# Patient Record
Sex: Female | Born: 1985 | Race: Black or African American | Hispanic: No | Marital: Single | Smoking: Never smoker
Health system: Southern US, Community
[De-identification: ages and names within clinical notes are randomized; demographics above are authoritative.]

## PROBLEM LIST (undated history)

## (undated) ENCOUNTER — Ambulatory Visit: Admission: EM

## (undated) DIAGNOSIS — K219 Gastro-esophageal reflux disease without esophagitis: Secondary | ICD-10-CM

## (undated) DIAGNOSIS — Z9109 Other allergy status, other than to drugs and biological substances: Secondary | ICD-10-CM

## (undated) DIAGNOSIS — E119 Type 2 diabetes mellitus without complications: Secondary | ICD-10-CM

## (undated) DIAGNOSIS — D649 Anemia, unspecified: Secondary | ICD-10-CM

## (undated) DIAGNOSIS — B977 Papillomavirus as the cause of diseases classified elsewhere: Secondary | ICD-10-CM

## (undated) HISTORY — DX: Papillomavirus as the cause of diseases classified elsewhere: B97.7

## (undated) HISTORY — PX: BARTHOLIN CYST MARSUPIALIZATION: SHX5383

---

## 2004-08-02 ENCOUNTER — Emergency Department: Payer: Self-pay | Admitting: Emergency Medicine

## 2007-11-25 ENCOUNTER — Emergency Department: Payer: Self-pay | Admitting: Emergency Medicine

## 2010-02-18 ENCOUNTER — Emergency Department: Payer: Self-pay | Admitting: Emergency Medicine

## 2010-06-09 ENCOUNTER — Ambulatory Visit: Payer: Self-pay | Admitting: Obstetrics & Gynecology

## 2010-06-11 ENCOUNTER — Ambulatory Visit: Payer: Self-pay | Admitting: Obstetrics & Gynecology

## 2010-09-25 ENCOUNTER — Emergency Department: Payer: Self-pay | Admitting: Emergency Medicine

## 2010-09-26 ENCOUNTER — Emergency Department: Payer: Self-pay | Admitting: Emergency Medicine

## 2012-04-14 DIAGNOSIS — R011 Cardiac murmur, unspecified: Secondary | ICD-10-CM | POA: Insufficient documentation

## 2013-07-03 ENCOUNTER — Emergency Department: Payer: Self-pay | Admitting: Emergency Medicine

## 2014-06-06 ENCOUNTER — Ambulatory Visit: Payer: Self-pay | Admitting: Podiatrist

## 2015-12-19 ENCOUNTER — Ambulatory Visit
Admission: EM | Admit: 2015-12-19 | Discharge: 2015-12-19 | Disposition: A | Discharge status: Home / Self Care | Payer: PRIVATE HEALTH INSURANCE | Attending: Family Medicine | Admitting: Family Medicine

## 2015-12-19 ENCOUNTER — Encounter: Payer: Self-pay | Admitting: *Deleted

## 2015-12-19 DIAGNOSIS — J0100 Acute maxillary sinusitis, unspecified: Secondary | ICD-10-CM | POA: Diagnosis not present

## 2015-12-19 DIAGNOSIS — J069 Acute upper respiratory infection, unspecified: Secondary | ICD-10-CM

## 2015-12-19 HISTORY — DX: Other allergy status, other than to drugs and biological substances: Z91.09

## 2015-12-19 HISTORY — DX: Anemia, unspecified: D64.9

## 2015-12-19 MED ORDER — BENZONATATE 200 MG PO CAPS: 200.0000 mg | ORAL_CAPSULE | Freq: Three times a day (TID) | ORAL | Status: DC | PRN

## 2015-12-19 MED ORDER — FEXOFENADINE-PSEUDOEPHED ER 180-240 MG PO TB24: 1.0000 | ORAL_TABLET | Freq: Every day | ORAL | Status: DC

## 2015-12-19 MED ORDER — AMOXICILLIN-POT CLAVULANATE 875-125 MG PO TABS: 1.0000 | ORAL_TABLET | Freq: Two times a day (BID) | ORAL | Status: DC

## 2015-12-19 NOTE — Discharge Instructions (Signed)
Upper Respiratory Infection, Adult °Most upper respiratory infections (URIs) are caused by a virus. A URI affects the nose, throat, and upper air passages. The most common type of URI is often called "the common cold." °HOME CARE  °· Take medicines only as told by your doctor. °· Gargle warm saltwater or take cough drops to comfort your throat as told by your doctor. °· Use a warm mist humidifier or inhale steam from a shower to increase air moisture. This may make it easier to breathe. °· Drink enough fluid to keep your pee (urine) clear or pale yellow. °· Eat soups and other clear broths. °· Have a healthy diet. °· Rest as needed. °· Go back to work when your fever is gone or your doctor says it is okay. °· You may need to stay home longer to avoid giving your URI to others. °· You can also wear a face mask and wash your hands often to prevent spread of the virus. °· Use your inhaler more if you have asthma. °· Do not use any tobacco products, including cigarettes, chewing tobacco, or electronic cigarettes. If you need help quitting, ask your doctor. °GET HELP IF: °· You are getting worse, not better. °· Your symptoms are not helped by medicine. °· You have chills. °· You are getting more short of breath. °· You have brown or red mucus. °· You have yellow or brown discharge from your nose. °· You have pain in your face, especially when you bend forward. °· You have a fever. °· You have puffy (swollen) neck glands. °· You have pain while swallowing. °· You have white areas in the back of your throat. °GET HELP RIGHT AWAY IF:  °· You have very bad or constant: °· Headache. °· Ear pain. °· Pain in your forehead, behind your eyes, and over your cheekbones (sinus pain). °· Chest pain. °· You have long-lasting (chronic) lung disease and any of the following: °· Wheezing. °· Long-lasting cough. °· Coughing up blood. °· A change in your usual mucus. °· You have a stiff neck. °· You have changes in  your: °· Vision. °· Hearing. °· Thinking. °· Mood. °MAKE SURE YOU:  °· Understand these instructions. °· Will watch your condition. °· Will get help right away if you are not doing well or get worse. °  °This information is not intended to replace advice given to you by your health care provider. Make sure you discuss any questions you have with your health care provider. °  °Document Released: 04/05/2008 Document Revised: 03/04/2015 Document Reviewed: 01/23/2014 °Elsevier Interactive Patient Education ©2016 [employer] ° °Sinusitis, Adult °Sinusitis is redness, soreness, and puffiness (inflammation) of the air pockets in the bones of your face (sinuses). The redness, soreness, and puffiness can cause air and mucus to get trapped in your sinuses. This can allow germs to grow and cause an infection.  °HOME CARE  °· Drink enough fluids to keep your pee (urine) clear or pale yellow. °· Use a humidifier in your home. °· Run a hot shower to create steam in the bathroom. Sit in the bathroom with the door closed. Breathe in the steam 3-4 times a day. °· Put a warm, moist washcloth on your face 3-4 times a day, or as told by your doctor. °· Use salt water sprays (saline sprays) to wet the thick fluid in your nose. This can help the sinuses drain. °· Only take medicine as told by your doctor. °GET HELP RIGHT AWAY IF:  °·   Your pain gets worse. °· You have very bad headaches. °· You are sick to your stomach (nauseous). °· You throw up (vomit). °· You are very sleepy (drowsy) all the time. °· Your face is puffy (swollen). °· Your vision changes. °· You have a stiff neck. °· You have trouble breathing. °MAKE SURE YOU:  °· Understand these instructions. °· Will watch your condition. °· Will get help right away if you are not doing well or get worse. °  °This information is not intended to replace advice given to you by your health care provider. Make sure you discuss any questions you have with your health care provider. °   °Document Released: 04/05/2008 Document Revised: 11/08/2014 Document Reviewed: 05/23/2012 °Elsevier Interactive Patient Education ©2016 [employer] ° °

## 2015-12-19 NOTE — ED Provider Notes (Signed)
CSN: [id number]     Arrival date & time 12/19/15  0865 History   First MD Initiated Contact with Patient 12/19/15 0900    Nurses notes were reviewed. Chief Complaint  Patient presents with  . Nasal Congestion  . Cough    Patient's here because of cough and nasal congestion. She reports no nasal congestion started on Sunday grossly getting worse. She states that she's had only yellowish-green drainage from the nostril she's coughing up clear mucus or chest. Denies any wheezing or ear pain but reports difficulty sleeping at night because of the congestion and difficulty breathing through her nostril and coughing is worse at night. She has irritated throat but not really sore throat this time. Patient does not smoke history of anemia environmental allergies.  No significant family history   (Consider location/radiation/quality/duration/timing/severity/associated sxs/prior Treatment) Patient is a 30 y.o. female presenting with cough. The history is provided by the patient. No language interpreter was used.  Cough Cough characteristics:  Productive and non-productive Sputum characteristics:  Green and yellow (Only when she blows her nose) Severity:  Moderate Onset quality:  Sudden Progression:  Worsening Context: upper respiratory infection   Relieved by:  Nothing Ineffective treatments:  Cough suppressants and steroid inhaler Associated symptoms: rhinorrhea and sinus congestion   Associated symptoms: no chest pain, no chills, no diaphoresis, no ear fullness, no ear pain, no fever, no headaches and no myalgias   Risk factors: no recent infection     Past Medical History  Diagnosis Date  . Anemia   . Environmental allergies    History reviewed. No pertinent past surgical history. History reviewed. No pertinent family history. Social History  Substance Use Topics  . Smoking status: Never Smoker   . Smokeless tobacco: Never Used  . Alcohol Use: No   OB History    No data available      Review of Systems  Constitutional: Negative for fever, chills and diaphoresis.  HENT: Positive for rhinorrhea. Negative for ear pain.   Respiratory: Positive for cough.   Cardiovascular: Negative for chest pain.  Musculoskeletal: Negative for myalgias.  Neurological: Negative for headaches.  All other systems reviewed and are negative.   Allergies  Review of patient's allergies indicates no known allergies.  Home Medications   Prior to Admission medications   Medication Sig Start Date End Date Taking? Authorizing Provider  amoxicillin-clavulanate (AUGMENTIN) 875-125 MG tablet Take 1 tablet by mouth 2 (two) times daily. 12/19/15   Hassan Rowan, MD  benzonatate (TESSALON) 200 MG capsule Take 1 capsule (200 mg total) by mouth 3 (three) times daily as needed for cough. 12/19/15   Hassan Rowan, MD  fexofenadine-pseudoephedrine (ALLEGRA-D ALLERGY & CONGESTION) 180-240 MG 24 hr tablet Take 1 tablet by mouth daily. 12/19/15   Hassan Rowan, MD   Meds Ordered and Administered this Visit  Medications - No data to display  BP 144/83 mmHg  Pulse 93  Temp(Src) 98.1 F (36.7 C) (Oral)  Resp 18  Ht  (1.6 m)  Wt 155 lb (70.308 kg)  BMI 27.46 kg/m2  SpO2 100%  LMP 12/18/2015 No data found.   Physical Exam  Constitutional: She is oriented to person, place, and time. She appears well-developed and well-nourished.  HENT:  Head: Normocephalic and atraumatic.  Right Ear: Hearing, tympanic membrane, external ear and ear canal normal.  Left Ear: Hearing, tympanic membrane, external ear and ear canal normal.  Nose: Mucosal edema and rhinorrhea present. Right sinus exhibits maxillary sinus tenderness. Left sinus exhibits  maxillary sinus tenderness.  Mouth/Throat: Posterior oropharyngeal erythema present.  Eyes: Conjunctivae are normal. Pupils are equal, round, and reactive to light.  Neck: Normal range of motion.  Cardiovascular: Normal rate and regular rhythm.   Pulmonary/Chest: Effort  normal and breath sounds normal.  Musculoskeletal: Normal range of motion.  Neurological: She is alert and oriented to person, place, and time.  Skin: Skin is warm.  Psychiatric: She has a normal mood and affect.  Vitals reviewed.   ED Course  Procedures (including critical care time)  Labs Review Labs Reviewed - No data to display  Imaging Review No results found.   Visual Acuity Review  Right Eye Distance:   Left Eye Distance:   Bilateral Distance:    Right Eye Near:   Left Eye Near:    Bilateral Near:         MDM   1. Acute maxillary sinusitis, recurrence not specified   2. Acute URI    We'll proceed to treat with antibiotics since it has been present for 5 days but getting worse. We'll place Augmentin 875 one tablet twice a day Tessalon Perles for cough she is using Flonase but did go over the proper way to administer Flonase and Allegra-D 24 hours 1 tablet a day. Work note for today and to see her PCP if not better in a week.    Hassan Rowan, MD 12/19/15 [phone]

## 2015-12-19 NOTE — ED Notes (Signed)
Patient started having nasal congestion one week ago. Patient has a history of allergies. Color of mucus is yellow.

## 2016-09-07 LAB — HM PAP SMEAR: HM PAP: NEGATIVE

## 2016-11-04 ENCOUNTER — Ambulatory Visit
Admission: EM | Admit: 2016-11-04 | Discharge: 2016-11-04 | Disposition: A | Discharge status: Home / Self Care | Payer: PRIVATE HEALTH INSURANCE | Attending: Family Medicine | Admitting: Family Medicine

## 2016-11-04 DIAGNOSIS — J0100 Acute maxillary sinusitis, unspecified: Secondary | ICD-10-CM

## 2016-11-04 DIAGNOSIS — R059 Cough, unspecified: Secondary | ICD-10-CM

## 2016-11-04 DIAGNOSIS — R05 Cough: Secondary | ICD-10-CM | POA: Diagnosis not present

## 2016-11-04 MED ORDER — FEXOFENADINE-PSEUDOEPHED ER 180-240 MG PO TB24: 1.0000 | ORAL_TABLET | Freq: Every day | ORAL | 0 refills | Status: DC

## 2016-11-04 MED ORDER — AMOXICILLIN-POT CLAVULANATE 875-125 MG PO TABS: 1.0000 | ORAL_TABLET | Freq: Two times a day (BID) | ORAL | 0 refills | Status: DC

## 2016-11-04 MED ORDER — FLUTICASONE PROPIONATE 50 MCG/ACT NA SUSP: 2.0000 | Freq: Every day | NASAL | 0 refills | Status: DC

## 2016-11-04 MED ORDER — HYDROCOD POLST-CPM POLST ER 10-8 MG/5ML PO SUER: 5.0000 mL | Freq: Two times a day (BID) | ORAL | 0 refills | Status: DC | PRN

## 2016-11-04 NOTE — ED Provider Notes (Signed)
[facility]    CSN: [id number] Arrival date & time: 11/04/16  1737     History   Chief Complaint Chief Complaint  Patient presents with  . Recurrent Sinusitis    HPI Tina Simon is a [age] y.o. female.   Tina Simon 31 year old black female who presents with nasal congestion and cough. Everything started over 2 weeks ago before Christmas about 2-1/2 weeks ago in fact. The nasal congestion is progressively gotten worse. Over the last 3-4 days a since of [place] to her chest and she is coughing as well. Since last 2 days work and wasn't able to go to work today. She denies any wheezing but of unable to sleep because the mucus is going down her throat she can't seem to bring it up. No known drug allergies. No previous surgeries or history of operations. No colonic medical problems in mother father both have hypertension and diabetes and patient never smoked.   The history is provided by the patient. No language interpreter was used.  URI  Presenting symptoms: congestion, cough, facial pain and rhinorrhea   Presenting symptoms: no ear pain, no fever and no sore throat   Severity:  Moderate Duration:  17 days Timing:  Constant Progression:  Worsening Chronicity:  New Relieved by:  Nothing Worsened by:  Nothing Ineffective treatments:  None tried Associated symptoms: sinus pain, sneezing and swollen glands   Associated symptoms: no arthralgias, no headaches, no myalgias, no neck pain and no wheezing     Past Medical History:  Diagnosis Date  . Anemia   . Environmental allergies     There are no active problems to display for this patient.   History reviewed. No pertinent surgical history.  OB History    No data available       Home Medications    Prior to Admission medications   Medication Sig Start Date End Date Taking? Authorizing Provider  amoxicillin-clavulanate (AUGMENTIN) 875-125 MG tablet Take 1 tablet by mouth 2 (two) times daily. 11/04/16   Hassan RowanEugene  Driana Dazey, MD  chlorpheniramine-HYDROcodone [facility](TUSSIONEX PENNKINETIC ER) 10-8 MG/5ML SUER Take 5 mLs by mouth every 12 (twelve) hours as needed for cough. 11/04/16   Hassan RowanEugene Dallas Scorsone, MD  fexofenadine-pseudoephedrine (ALLEGRA-D ALLERGY & CONGESTION) 180-240 MG 24 hr tablet Take 1 tablet by mouth daily. 11/04/16   Hassan RowanEugene Jailah Willis, MD  fluticasone (FLONASE) 50 MCG/ACT nasal spray Place 2 sprays into both nostrils daily. 11/04/16   Hassan RowanEugene Millette Halberstam, MD    Family History Family History  Problem Relation Age of Onset  . Diabetes Mother   . Hypertension Mother   . Hypertension Father   . Diabetes Father     Social History Social History  Substance Use Topics  . Smoking status: Never Smoker  . Smokeless tobacco: Never Used  . Alcohol use No     Allergies   Patient has no known allergies.   Review of Systems Review of Systems  Constitutional: Negative for fever.  HENT: Positive for congestion, rhinorrhea, sinus pain and sneezing. Negative for ear pain and sore throat.   Respiratory: Positive for cough. Negative for wheezing.   Musculoskeletal: Negative for arthralgias, myalgias and neck pain.  Neurological: Negative for headaches.  All other systems reviewed and are negative.    Physical Exam Triage Vital Signs ED Triage Vitals  Enc Vitals Group     BP 11/04/16 1833 129/84     Pulse Rate 11/04/16 1833 82     Resp 11/04/16 1833 18  Temp 11/04/16 1833 98.7 F (37.1 C)     Temp Source 11/04/16 1833 Oral     SpO2 11/04/16 1833 100 %     Weight 11/04/16 1834 165 lb (74.8 kg)     Height 11/04/16 1834 5\' 3"  (1.6 m)     Head Circumference --      Peak Flow --      Pain Score 11/04/16 1835 8     Pain Loc --      Pain Edu? --      Excl. in GC? --    No data found.   Updated Vital Signs BP 129/84 (BP Location: Left Arm)   Pulse 82   Temp 98.7 F (37.1 C) (Oral)   Resp 18   Ht 5\' 3"  (1.6 m)   Wt 165 lb (74.8 kg)   LMP 11/04/2016   SpO2 100%   BMI 29.23 kg/m   Visual Acuity Right Eye  Distance:   Left Eye Distance:   Bilateral Distance:    Right Eye Near:   Left Eye Near:    Bilateral Near:     Physical Exam  Constitutional: She appears well-developed and well-nourished. No distress.  HENT:  Head: Normocephalic and atraumatic.  Right Ear: Hearing, tympanic membrane, external ear and ear canal normal.  Left Ear: Hearing, tympanic membrane, external ear and ear canal normal.  Nose: Mucosal edema, rhinorrhea and sinus tenderness present. Right sinus exhibits maxillary sinus tenderness and frontal sinus tenderness. Left sinus exhibits maxillary sinus tenderness and frontal sinus tenderness.  Mouth/Throat: Uvula is midline, oropharynx is clear and moist and mucous membranes are normal.  Eyes: Conjunctivae are normal. Pupils are equal, round, and reactive to light.  Neck: Normal range of motion. Neck supple.  Cardiovascular: Normal rate and regular rhythm.   Pulmonary/Chest: Effort normal and breath sounds normal. No respiratory distress.  Musculoskeletal: Normal range of motion.  Neurological: She is alert.  Skin: Skin is warm. She is not diaphoretic.  Psychiatric: She has a normal mood and affect.  Vitals reviewed.    UC Treatments / Results  Labs (all labs ordered are listed, but only abnormal results are displayed) Labs Reviewed - No data to display  EKG  EKG Interpretation None       Radiology No results found.  Procedures Procedures (including critical care time)  Medications Ordered in UC Medications - No data to display   Initial Impression / Assessment and Plan / UC Course  I have reviewed the triage vital signs and the nursing notes.  Pertinent labs & imaging results that were available during my care of the patient were reviewed by me and considered in my medical decision making (see chart for details).  Clinical Course     Will treat patient for sinus infection will place on Allegra-D 1 tablet day Flonase nasal spray 2 puffs each  nostril daily Augmentin 875 one tablet twice a day and for the cough S so persistent Tussionex 1 teaspoon twice a day work note for work for today and tomorrow and follow-up PCP if not better in a week.  Final Clinical Impressions(s) / UC Diagnoses   Final diagnoses:  Acute maxillary sinusitis, recurrence not specified  Cough    New Prescriptions Discharge Medication List as of 11/04/2016  7:32 PM    START taking these medications   Details  amoxicillin-clavulanate (AUGMENTIN) 875-125 MG tablet Take 1 tablet by mouth 2 (two) times daily., Starting Thu 11/04/2016, Normal    chlorpheniramine-HYDROcodone (TUSSIONEX PENNKINETIC  ER) 10-8 MG/5ML SUER Take 5 mLs by mouth every 12 (twelve) hours as needed for cough., Starting Thu 11/04/2016, Normal    fexofenadine-pseudoephedrine (ALLEGRA-D ALLERGY & CONGESTION) 180-240 MG 24 hr tablet Take 1 tablet by mouth daily., Starting Thu 11/04/2016, Normal    fluticasone (FLONASE) 50 MCG/ACT nasal spray Place 2 sprays into both nostrils daily., Starting Thu 11/04/2016, Normal         Hassan Rowan, MD 11/04/16 [phone]

## 2016-11-04 NOTE — ED Triage Notes (Addendum)
Pt c/o cough, sinus pressure, headache, and chest congestion and pains form coughing so much.

## 2017-09-29 ENCOUNTER — Ambulatory Visit (INDEPENDENT_AMBULATORY_CARE_PROVIDER_SITE_OTHER): Payer: PRIVATE HEALTH INSURANCE | Admitting: Obstetrics & Gynecology

## 2017-09-29 ENCOUNTER — Encounter: Payer: Self-pay | Admitting: Obstetrics & Gynecology

## 2017-09-29 VITALS — BP 158/80 | HR 104 | Ht 64.0 in | Wt 183.0 lb

## 2017-09-29 DIAGNOSIS — Z124 Encounter for screening for malignant neoplasm of cervix: Secondary | ICD-10-CM | POA: Diagnosis not present

## 2017-09-29 DIAGNOSIS — Z1321 Encounter for screening for nutritional disorder: Secondary | ICD-10-CM

## 2017-09-29 DIAGNOSIS — Z0000 Encounter for general adult medical examination without abnormal findings: Secondary | ICD-10-CM | POA: Diagnosis not present

## 2017-09-29 DIAGNOSIS — Z8742 Personal history of other diseases of the female genital tract: Secondary | ICD-10-CM | POA: Insufficient documentation

## 2017-09-29 DIAGNOSIS — Z13220 Encounter for screening for lipoid disorders: Secondary | ICD-10-CM

## 2017-09-29 DIAGNOSIS — R87610 Atypical squamous cells of undetermined significance on cytologic smear of cervix (ASC-US): Secondary | ICD-10-CM | POA: Diagnosis not present

## 2017-09-29 DIAGNOSIS — Z131 Encounter for screening for diabetes mellitus: Secondary | ICD-10-CM | POA: Diagnosis not present

## 2017-09-29 DIAGNOSIS — N898 Other specified noninflammatory disorders of vagina: Secondary | ICD-10-CM | POA: Diagnosis not present

## 2017-09-29 DIAGNOSIS — Z1329 Encounter for screening for other suspected endocrine disorder: Secondary | ICD-10-CM

## 2017-09-29 MED ORDER — LEVONORGEST-ETH ESTRAD 91-DAY 0.15-0.03 MG PO TABS: 1.0000 | ORAL_TABLET | Freq: Every day | ORAL | 4 refills | Status: DC

## 2017-09-29 NOTE — Patient Instructions (Signed)
PAP every year Labs yearly   Ethinyl Estradiol; Levonorgestrel tablets What is this medicine? ETHINYL ESTRADIOL; LEVONORGESTREL (ETH in il es tra DYE ole; LEE voh nor jes trel) is an oral contraceptive. It combines two types of female hormones, an estrogen and a progestin. They are used to prevent ovulation and pregnancy. This medicine may be used for other purposes; ask your health care provider or pharmacist if you have questions. COMMON BRAND NAME(S): Alesse, Altavera, Amethia, Amethia Lo, Amethyst, Point IsabelAshlyna, Aubra-28, Aviane, Camrese, Camrese Lo, [place] Cen[place]al, [place], [address],Krise 3Delyla, Bone[place]Enpresse, SeamaFALMINA, Brown[place]Fayosin, Maur[place]entrovale, Isibloom, Mill[place]Jolessa, DunlapKurvelo, LamesaLessina, GeigerLevlen, [place] JeffersonLevlite, Bay ShoreLEVONEST, Levonorgestrel/Ethinyl Estradiol, [place]ora, StocktonLoSeasonique, Wheel[place]Lutera, IngerLybrel, River RidgeMARLISSA, FairviewMyzilra, LoganNordette, PendletonOrsythia, Palm HarborPortia, [place]Quartette, Plain CityQuasense, Wat[place]Seasonale, FairviewSeasonique, Farmers[place]Setlakin, OrangeburgSronyx, Clarissari-Levlen, Triphasil, Debroah Ballerrivora, Vienva What should I tell my health care provider before I take this medicine? They need to know if you have or ever had any of these conditions: -abnormal vaginal bleeding -blood vessel disease or blood clots -breast, cervical, endometrial, ovarian, liver, or uterine cancer -diabetes -gallbladder disease -heart disease or recent heart attack -high blood pressure -high cholesterol -kidney disease -liver disease -migraine headaches -stroke -systemic lupus erythematosus (SLE) -tobacco smoker -an unusual or allergic reaction to estrogens, progestins, other medicines, foods, dyes, or preservatives -pregnant or trying to get pregnant -breast-feeding How should I use this medicine? Take this medicine by mouth. To reduce nausea, this medicine may be taken with food. Follow the directions on the prescription label. Take this medicine at the same time each day and in the order directed on the package. Do not take your medicine more often than directed. Contact your pediatrician regarding the use of this medicine in  children. Special care may be needed. This medicine has been used in female children who have started having menstrual periods. A patient package insert for the product will be given with each prescription and refill. Read this sheet carefully each time. The sheet may change frequently. Overdosage: If you think you have taken too much of this medicine contact a poison control center or emergency room at once. NOTE: This medicine is only for you. Do not share this medicine with others. What if I miss a dose? If you miss a dose, refer to the patient information sheet you received with your medicine for direction. If you miss more than one pill, this medicine may not be as effective and you may need to use another form of birth control. What may interact with this medicine? Do not take this medicine with the following medication: -dasabuvir; ombitasvir; paritaprevir; ritonavir -ombitasvir; paritaprevir; ritonavir This medicine may also interact with the following medications: -acetaminophen -antibiotics or medicines for infections, especially rifampin, rifabutin, rifapentine, and griseofulvin, and possibly penicillins or tetracyclines -aprepitant -ascorbic acid (vitamin C) -atorvastatin -barbiturate medicines, such as phenobarbital -bosentan -carbamazepine -caffeine -clofibrate -cyclosporine -dantrolene -doxercalciferol -felbamate -grapefruit juice -hydrocortisone -medicines for anxiety or sleeping problems, such as diazepam or temazepam -medicines for diabetes, including pioglitazone -mineral oil -modafinil -mycophenolate -nefazodone -oxcarbazepine -phenytoin -prednisolone -ritonavir or other medicines for HIV infection or AIDS -rosuvastatin -selegiline -soy isoflavones supplements -St. John's wort -tamoxifen or raloxifene -theophylline -thyroid hormones -topiramate -warfarin This list may not describe all possible interactions. Give your health care provider a list of all  the medicines, herbs, non-prescription drugs, or dietary supplements you use. Also tell them if you smoke, drink alcohol, or use illegal drugs. Some items may interact with your medicine. What should I watch for while using this medicine? Visit your doctor or health care professional for regular checks on your progress. You  will need a regular breast and pelvic exam and Pap smear while on this medicine. Use an additional method of contraception during the first cycle that you take these tablets. If you have any reason to think you are pregnant, stop taking this medicine right away and contact your doctor or health care professional. If you are taking this medicine for hormone related problems, it may take several cycles of use to see improvement in your condition. Smoking increases the risk of getting a blood clot or having a stroke while you are taking birth control pills, especially if you are more than 33 years old. You are strongly advised not to smoke. This medicine can make your body retain fluid, making your fingers, hands, or ankles swell. Your blood pressure can go up. Contact your doctor or health care professional if you feel you are retaining fluid. This medicine can make you more sensitive to the sun. Keep out of the sun. If you cannot avoid being in the sun, wear protective clothing and use sunscreen. Do not use sun lamps or tanning beds/booths. If you wear contact lenses and notice visual changes, or if the lenses begin to feel uncomfortable, consult your eye care specialist. In some women, tenderness, swelling, or minor bleeding of the gums may occur. Notify your dentist if this happens. Brushing and flossing your teeth regularly may help limit this. See your dentist regularly and inform your dentist of the medicines you are taking. If you are going to have elective surgery, you may need to stop taking this medicine before the surgery. Consult your health care professional for  advice. This medicine does not protect you against HIV infection (AIDS) or any other sexually transmitted diseases. What side effects may I notice from receiving this medicine? Side effects that you should report to your doctor or health care professional as soon as possible: -breast tissue changes or discharge -changes in vaginal bleeding during your period or between your periods -chest pain -coughing up blood -dizziness or fainting spells -headaches or migraines -leg, arm or groin pain -severe or sudden headaches -stomach pain (severe) -sudden shortness of breath -sudden loss of coordination, especially on one side of the body -speech problems -symptoms of vaginal infection like itching, irritation or unusual discharge -tenderness in the upper abdomen -vomiting -weakness or numbness in the arms or legs, especially on one side of the body -yellowing of the eyes or skin Side effects that usually do not require medical attention (report to your doctor or health care professional if they continue or are bothersome): -breakthrough bleeding and spotting that continues beyond the 3 initial cycles of pills -breast tenderness -mood changes, anxiety, depression, frustration, anger, or emotional outbursts -increased sensitivity to sun or ultraviolet light -nausea -skin rash, acne, or brown spots on the skin -weight gain (slight) This list may not describe all possible side effects. Call your doctor for medical advice about side effects. You may report side effects to FDA at 1-800-FDA-1088. Where should I keep my medicine? Keep out of the reach of children. Store at room temperature between 15 and 30 degrees C (59 and 86 degrees F). Throw away any unused medicine after the expiration date. NOTE: This sheet is a summary. It may not cover all possible information. If you have questions about this medicine, talk to your doctor, pharmacist, or health care provider.  2018 Elsevier/Gold Standard  (2016-06-28 07:58:22)

## 2017-09-29 NOTE — Progress Notes (Signed)
HPI:      [name] is a 31 y.o. G0P0000 who LMP was Patient's last menstrual period was 09/10/2017., she presents today for her annual examination. The patient has no complaints today. The patient is sexually active. Her last pap: approximate date 2017 normal and 2013 and was abnormal: ASCUS. The patient does perform self breast exams.  There is no notable family history of breast or ovarian cancer in her family.  The patient has regular exercise: yes.  The patient denies current symptoms of depression.    GYN History: Contraception: none  PMHx: Past Medical History:  Diagnosis Date  . Anemia   . Environmental allergies   . HPV in female    Past Surgical History:  Procedure Laterality Date  . BARTHOLIN CYST MARSUPIALIZATION     Family History  Problem Relation Age of Onset  . Diabetes Mother   . Hypertension Mother   . Hypertension Father   . Diabetes Father    Social History   Tobacco Use  . Smoking status: Never Smoker  . Smokeless tobacco: Never Used  Substance Use Topics  . Alcohol use: No  . Drug use: No    Current Outpatient Medications:  .  amoxicillin-clavulanate (AUGMENTIN) 875-125 MG tablet, Take 1 tablet by mouth 2 (two) times daily. (Patient not taking: Reported on 09/29/2017), Disp: 20 tablet, Rfl: 0 .  chlorpheniramine-HYDROcodone (TUSSIONEX PENNKINETIC ER) 10-8 MG/5ML SUER, Take 5 mLs by mouth every 12 (twelve) hours as needed for cough. (Patient not taking: Reported on 09/29/2017), Disp: 115 mL, Rfl: 0 .  fexofenadine-pseudoephedrine (ALLEGRA-D ALLERGY & CONGESTION) 180-240 MG 24 hr tablet, Take 1 tablet by mouth daily. (Patient not taking: Reported on 09/29/2017), Disp: 30 tablet, Rfl: 0 .  fluticasone (FLONASE) 50 MCG/ACT nasal spray, Place 2 sprays into both nostrils daily. (Patient not taking: Reported on 09/29/2017), Disp: 16 g, Rfl: 0 .  levonorgestrel-ethinyl estradiol (SEASONALE,INTROVALE,JOLESSA) 0.15-0.03 MG tablet, Take 1 tablet by  mouth daily., Disp: 1 Package, Rfl: 4 Allergies: Patient has no known allergies.  Review of Systems  Constitutional: Negative for chills, fever and malaise/fatigue.  HENT: Negative for congestion, sinus pain and sore throat.   Eyes: Negative for blurred vision and pain.  Respiratory: Negative for cough and wheezing.   Cardiovascular: Negative for chest pain and leg swelling.  Gastrointestinal: Negative for abdominal pain, constipation, diarrhea, heartburn, nausea and vomiting.  Genitourinary: Negative for dysuria, frequency, hematuria and urgency.  Musculoskeletal: Negative for back pain, joint pain, myalgias and neck pain.  Skin: Negative for itching and rash.  Neurological: Negative for dizziness, tremors and weakness.  Endo/Heme/Allergies: Does not bruise/bleed easily.  Psychiatric/Behavioral: Negative for depression. The patient is not nervous/anxious and does not have insomnia.     Objective: BP (!) 158/80   Pulse (!) 104   Ht 5\' 4"  (1.626 m)   Wt 183 lb (83 kg)   LMP 09/10/2017   BMI 31.41 kg/m   Filed Weights   09/29/17 1429  Weight: 183 lb (83 kg)   Body mass index is 31.41 kg/m. Physical Exam  Constitutional: She is oriented to person, place, and time. She appears well-developed and well-nourished. No distress.  Genitourinary: Rectum normal, vagina normal and uterus normal. Pelvic exam was performed with patient supine. There is no rash or lesion on the right labia. There is no rash or lesion on the left labia. Vagina exhibits no lesion. No bleeding in the vagina. Right adnexum does not display mass and does not display  tenderness. Left adnexum does not display mass and does not display tenderness. Cervix does not exhibit motion tenderness, lesion, friability or polyp.   Uterus is mobile and midaxial. Uterus is not enlarged or exhibiting a mass.  HENT:  Head: Normocephalic and atraumatic. Head is without laceration.  Right Ear: Hearing normal.  Left Ear: Hearing  normal.  Nose: No epistaxis.  No foreign bodies.  Mouth/Throat: Uvula is midline, oropharynx is clear and moist and mucous membranes are normal.  Eyes: Pupils are equal, round, and reactive to light.  Neck: Normal range of motion. Neck supple. No thyromegaly present.  Cardiovascular: Normal rate and regular rhythm. Exam reveals no gallop and no friction rub.  No murmur heard. Pulmonary/Chest: Effort normal and breath sounds normal. No respiratory distress. She has no wheezes. Right breast exhibits no mass, no skin change and no tenderness. Left breast exhibits no mass, no skin change and no tenderness.  Abdominal: Soft. Bowel sounds are normal. She exhibits no distension. There is no tenderness. There is no rebound.  Musculoskeletal: Normal range of motion.  Neurological: She is alert and oriented to person, place, and time. No cranial nerve deficit.  Skin: Skin is warm and dry.  Psychiatric: She has a normal mood and affect. Judgment normal.  Vitals reviewed.   Assessment:  ANNUAL EXAM 1. Annual physical exam   2. Screening for cervical cancer   3. Atypical squamous cells of undetermined significance (ASCUS) on Papanicolaou smear of cervix   4. Vaginal discharge   5. Screening for cholesterol level   6. Screening for thyroid disorder   7. Screening for diabetes mellitus   8. Encounter for vitamin deficiency screening      Screening Plan:            1.  Cervical Screening-  Pap smear done today  2. Breast screening- Exam annually and mammogram>40 planned   3. Colonoscopy every 10 years, Hemoccult testing - after age 33  4. Labs To return fasting at a later date  5. Counseling for contraception: oral contraceptives (estrogen/progesterone)  Other:  1. Annual physical exam  2. Screening for cervical cancer - IGP, Aptima HPV  3. Atypical squamous cells of undetermined significance (ASCUS) on Papanicolaou smear of cervix - IGP, Aptima HPV  4. Vaginal discharge - NuSwab  Vaginitis (VG)  5. Screening for cholesterol level - Lipid panel; Future  6. Screening for thyroid disorder - TSH; Future  7. Screening for diabetes mellitus - Glucose, fasting; Future  8. Encounter for vitamin deficiency screening - VITAMIN D 25 Hydroxy (Vit-D Deficiency, Fractures); Future  9. Extended pill due to menorrhagia and perimenstrual discharge chronic.  Comsider IUD (Mirena).    F/U  Return in about 1 year (around 09/29/2018) for Annual.  Annamarie MajorPaul Verbon Giangregorio, MD, Merlinda FrederickFACOG [facility], [facility] 09/29/2017  3:00 PM

## 2017-10-03 LAB — NUSWAB VAGINITIS (VG)
Atopobium vaginae: HIGH Score — AB
Candida albicans, NAA: NEGATIVE
Candida glabrata, NAA: NEGATIVE
Trich vag by NAA: NEGATIVE

## 2017-10-03 LAB — IGP, APTIMA HPV
HPV Aptima: NEGATIVE
PAP Smear Comment: 0

## 2017-10-04 ENCOUNTER — Other Ambulatory Visit: Payer: PRIVATE HEALTH INSURANCE

## 2017-10-04 NOTE — Addendum Note (Signed)
Addended by: Ova FreshwaterWILSON, Jolette Lana R on: 10/04/2017 08:19 AM   Modules accepted: Orders

## 2017-10-05 ENCOUNTER — Encounter: Payer: Self-pay | Admitting: Obstetrics & Gynecology

## 2017-10-05 LAB — LIPID PANEL
CHOL/HDL RATIO: 3.7 ratio (ref 0.0–4.4)
CHOLESTEROL TOTAL: 190 mg/dL (ref 100–199)
HDL: 52 mg/dL (ref >39)
LDL CALC: 118 mg/dL — AB (ref 0–99)
TRIGLYCERIDES: 102 mg/dL (ref 0–149)
VLDL CHOLESTEROL CAL: 20 mg/dL (ref 5–40)

## 2017-10-05 LAB — VITAMIN D 25 HYDROXY (VIT D DEFICIENCY, FRACTURES): VIT D 25 HYDROXY: 15.3 ng/mL — AB (ref 30.0–100.0)

## 2017-10-05 LAB — GLUCOSE, FASTING: Glucose, Plasma: 91 mg/dL (ref 65–99)

## 2017-10-05 LAB — TSH: TSH: 1.19 u[IU]/mL (ref 0.450–4.500)

## 2017-10-19 ENCOUNTER — Telehealth: Payer: Self-pay | Admitting: Obstetrics & Gynecology

## 2017-10-19 NOTE — Telephone Encounter (Signed)
Left VM for pt to call office back to confirm her address. Please update once patient calls back.

## 2017-10-19 NOTE — Telephone Encounter (Signed)
Pt is requesting for at detailed  message with physical form was faxed. Please advise pt is at work.

## 2017-10-19 NOTE — Telephone Encounter (Signed)
lmtrc

## 2017-10-19 NOTE — Telephone Encounter (Signed)
Pt is call for labs results. Please advise. Pt is also wanting to know if we faxed her physical form

## 2017-10-20 NOTE — Telephone Encounter (Signed)
alrighty

## 2018-05-26 ENCOUNTER — Ambulatory Visit: Payer: PRIVATE HEALTH INSURANCE | Admitting: Obstetrics and Gynecology

## 2018-06-13 ENCOUNTER — Encounter: Payer: Self-pay | Admitting: Certified Nurse Midwife

## 2018-06-13 ENCOUNTER — Ambulatory Visit (INDEPENDENT_AMBULATORY_CARE_PROVIDER_SITE_OTHER): Payer: PRIVATE HEALTH INSURANCE | Admitting: Certified Nurse Midwife

## 2018-06-13 VITALS — BP 129/83 | HR 112 | Ht 64.0 in | Wt 179.6 lb

## 2018-06-13 DIAGNOSIS — L731 Pseudofolliculitis barbae: Secondary | ICD-10-CM | POA: Diagnosis not present

## 2018-06-13 MED ORDER — CLINDAMYCIN HCL 300 MG PO CAPS: 300.0000 mg | ORAL_CAPSULE | Freq: Three times a day (TID) | ORAL | 0 refills | Status: AC

## 2018-06-13 NOTE — Progress Notes (Signed)
New pt is here with c/o ingrown pubic hair left side labia. [facility] for [facility]

## 2018-06-13 NOTE — Patient Instructions (Signed)
Ingrown Hair An ingrown hair is a hair that curls and re-enters the skin instead of growing straight out of the skin. An ingrown hair can develop in any part of the skin that hair is removed from. An ingrown hair may cause small pockets of infection. What are the causes? An ingrown hair can be caused by:  Shaving.  Tweezing.  Waxing.  Using a hair removal cream.  What increases the risk? Ingrown hairs are more likely to develop in people who have curly hair. What are the signs or symptoms? Symptoms of an ingrown hair may include:  Small bumps on the skin. The bumps may be filled with pus. Pain.Clindamycin capsules What is this medicine? CLINDAMYCIN (KLIN da MYE sin) is a lincosamide antibiotic. It is used to treat certain kinds of bacterial infections. It will not work for colds, flu, or other viral infections. This medicine may be used for other purposes; ask your health care provider or pharmacist if you have questions. COMMON BRAND NAME(S): Cleocin What should I tell my health care provider before I take this medicine? They need to know if you have any of these conditions: -kidney disease -liver disease -stomach problems like colitis -an unusual or allergic reaction to clindamycin, lincomycin, or other medicines, foods, dyes like tartrazine or preservatives -pregnant or trying to get pregnant -breast-feeding How should I use this medicine? Take this medicine by mouth with a full glass of water. Follow the directions on the prescription label. You can take this medicine with food or on an empty stomach. If the medicine upsets your stomach, take it with food. Take your medicine at regular intervals. Do not take your medicine more often than directed. Take all of your medicine as directed even if you think your are better. Do not skip doses or stop your medicine early. Talk to your pediatrician regarding the use of this medicine in children. Special care may be needed. Overdosage:  If you think you have taken too much of this medicine contact a poison control center or emergency room at once. NOTE: This medicine is only for you. Do not share this medicine with others. What if I miss a dose? If you miss a dose, take it as soon as you can. If it is almost time for your next dose, take only that dose. Do not take double or extra doses. What may interact with this medicine? -birth control pills -erythromycin -medicines that relax muscles for surgery -rifampin This list may not describe all possible interactions. Give your health care provider a list of all the medicines, herbs, non-prescription drugs, or dietary supplements you use. Also tell them if you smoke, drink alcohol, or use illegal drugs. Some items may interact with your medicine. What should I watch for while using this medicine? Tell your doctor or healthcare professional if your symptoms do not start to get better or if they get worse. Do not treat diarrhea with over the counter products. Contact your doctor if you have diarrhea that lasts more than 2 days or if it is severe and watery. What side effects may I notice from receiving this medicine? Side effects that you should report to your doctor or health care professional as soon as possible: -allergic reactions like skin rash, itching or hives, swelling of the face, lips, or tongue -dark urine -pain on swallowing -redness, blistering, peeling or loosening of the skin, including inside the mouth -unusual bleeding or bruising -unusually weak or tired -yellowing of eyes or skin Side effects  that usually do not require medical attention (report to your doctor or health care professional if they continue or are bothersome): -diarrhea -itching in the rectal or genital area -joint pain -nausea, vomiting -stomach pain This list may not describe all possible side effects. Call your doctor for medical advice about side effects. You may report side effects to FDA  at 1-800-FDA-1088. Where should I keep my medicine? Keep out of the reach of children. Store at room temperature between 20 and 25 degrees C (68 and 77 degrees F). Throw away any unused medicine after the expiration date. NOTE: This sheet is a summary. It may not cover all possible information. If you have questions about this medicine, talk to your doctor, pharmacist, or health care provider.  2018 Elsevier/Gold Standard (2016-01-21 16:34:00)    Itching.  How is this diagnosed? An ingrown hair is diagnosed with a skin exam. How is this treated? Treatment is often not needed unless the ingrown hair has caused an infection. Treatment may involve:  Applying prescription creams to the skin. This can help with inflammation.  Applying warm compresses to the skin. This can help soften the skin.  Taking antibiotic medicine. An antibiotic may be prescribed if the infection is severe.  Follow these instructions at home:  Do not shave irritated areas of skin. You may start shaving these areas again once the irritation has gone away.  Take, apply, or use over-the-counter and prescription medicines only as told by your health care provider. This includes any prescription creams.  If you were prescribed an antibiotic medicine, take it as told by your health care provider. Do not stop taking the antibiotic even if your condition improves.  To help remove ingrown hairs on your face, you may use a facial sponge in a gentle circular motion.  If directed, apply heat to the affected area. Use the heat source that your health care provider recommends, such as a moist heat pack or a heating pad. ? Place a towel between your skin and the heat source. ? Leave the heat on for 20-30 minutes. ? Remove the heat if your skin turns bright red. This is especially important if you are unable to feel pain, heat, or cold. You may have a greater risk of getting burned. How is this prevented?  Shower before  shaving.  Wrap areas that you are going to shave in warm, moist wraps for several minutes before shaving. The warmth and moisture helps to soften the hairs and makes ingrown hairs less likely.  Use thick shaving gels.  Use a razor that cuts hair slightly above your skin. Or, use an electric shaver with a long shave setting.  Shave in the direction of hair growth.  Avoid making multiple razor strokes.  Apply a moisturizing lotion after shaving. This information is not intended to replace advice given to you by your health care provider. Make sure you discuss any questions you have with your health care provider. Document Released: 01/24/2001 Document Revised: 05/07/2016 Document Reviewed: 08/08/2015 Elsevier Interactive Patient Education  2018 [employer]

## 2018-06-13 NOTE — Progress Notes (Signed)
Tina Simon is a 32 y.o. year old African American female here for incision and drainage of left labial/vulvar furuncle. Seen at [facility] approximately two weeks ago. The area was drained and she completed a course of Keflex. Redness to area reappeared on week after completing the antibiotics. The area is tender to touch.   BP 129/83   Pulse (!) 112   Ht 5\' 4"  (1.626 m)   Wt 179 lb 9 oz (81.4 kg)   LMP 05/22/2018 (Exact Date)   BMI 30.82 kg/m   Appropriate time out taken. Site identified.  Area prepped in usual sterile fashon. One cc of 2% lidocaine was used to anesthetize the area. A small stab incision was made and drainage was collected; an ingrown hair was also removed with sterile tweezers. There was less than 3 cc blood loss. There were no complications. The patient tolerated the procedure well.  She was instructed to keep the area clean and dry. Discussed home treatment measures.   Rx: Clindamycin, see orders.   Reviewed red flag symptoms and when to call.     RTC as needed.    Gunnar BullaJenkins Michelle Naseer Hearn, CNM [facility], [facility]

## 2018-06-18 LAB — ANAEROBIC AND AEROBIC CULTURE

## 2018-06-19 NOTE — Progress Notes (Signed)
Please contact patient. Swab negative for infection. Encourage to activate MyChart. Follow up as needed. JML

## 2018-06-20 ENCOUNTER — Telehealth: Payer: Self-pay

## 2018-06-20 NOTE — Telephone Encounter (Signed)
Pt informed of neg test results per ML. Encouraged to activate mychart- pt was in agreement.

## 2018-09-26 ENCOUNTER — Encounter: Payer: PRIVATE HEALTH INSURANCE | Admitting: Certified Nurse Midwife

## 2018-10-02 ENCOUNTER — Ambulatory Visit (INDEPENDENT_AMBULATORY_CARE_PROVIDER_SITE_OTHER): Payer: PRIVATE HEALTH INSURANCE | Admitting: Certified Nurse Midwife

## 2018-10-02 ENCOUNTER — Encounter: Payer: Self-pay | Admitting: Certified Nurse Midwife

## 2018-10-02 VITALS — BP 139/86 | HR 116 | Ht 64.0 in | Wt 187.6 lb

## 2018-10-02 DIAGNOSIS — Z01419 Encounter for gynecological examination (general) (routine) without abnormal findings: Secondary | ICD-10-CM | POA: Diagnosis not present

## 2018-10-02 DIAGNOSIS — M549 Dorsalgia, unspecified: Secondary | ICD-10-CM

## 2018-10-02 DIAGNOSIS — Z6832 Body mass index (BMI) 32.0-32.9, adult: Secondary | ICD-10-CM

## 2018-10-02 DIAGNOSIS — N946 Dysmenorrhea, unspecified: Secondary | ICD-10-CM

## 2018-10-02 DIAGNOSIS — E669 Obesity, unspecified: Secondary | ICD-10-CM

## 2018-10-02 NOTE — Progress Notes (Signed)
ANNUAL PREVENTATIVE CARE GYN  ENCOUNTER NOTE  Subjective:       Tina Simon is a 32 y.o. G0P0000 female here for a routine annual gynecologic exam.  Current complaints: 1. Back pain for the last nine (9) years-seeing chiropractor tomorrow 2. Dysmenorrhea  Declines flu vaccine this year. Wishes to defer pelvic exam and pap smear until end of cycle.   Denies difficulty breathing or respiratory distress, chest pain, abdominal pain, dysuria, and leg pain or swelling.    Gynecologic History  Patient's last menstrual period was 09/28/2018 (exact date). Period Cycle (Days): 28 Period Duration (Days): 7 Period Pattern: Regular Menstrual Flow: Moderate, Heavy Menstrual Control: Tampon Dysmenorrhea: (!) Severe Dysmenorrhea Symptoms: Cramping  Contraception: none  Last Pap: 2018. Results were: normal  Obstetric History  OB History  Gravida Para Term Preterm AB Living  0 0 0 0 0 0  SAB TAB Ectopic Multiple Live Births  0 0 0 0 0    Past Medical History:  Diagnosis Date  . Anemia   . Environmental allergies   . HPV in female     Past Surgical History:  Procedure Laterality Date  . BARTHOLIN CYST MARSUPIALIZATION      Current Outpatient Medications on File Prior to Visit  Medication Sig Dispense Refill  . loratadine (CLARITIN) 10 MG tablet Take 10 mg by mouth daily.    . Multiple Vitamins-Minerals (MULTIVITAMIN ADULT EXTRA C PO) Take by mouth.    . Probiotic Product (PROBIOTIC-10 PO) Take by mouth.     No current facility-administered medications on file prior to visit.     No Known Allergies  Social History   Socioeconomic History  . Marital status: Single    Spouse name: Not on file  . Number of children: Not on file  . Years of education: Not on file  . Highest education level: Not on file  Occupational History  . Not on file  Social Needs  . Financial resource strain: Not on file  . Food insecurity:    Worry: Not on file    Inability: Not on file  .  Transportation needs:    Medical: Not on file    Non-medical: Not on file  Tobacco Use  . Smoking status: Never Smoker  . Smokeless tobacco: Never Used  Substance and Sexual Activity  . Alcohol use: No  . Drug use: No  . Sexual activity: Not Currently    Birth control/protection: None  Lifestyle  . Physical activity:    Days per week: Not on file    Minutes per session: Not on file  . Stress: Not on file  Relationships  . Social connections:    Talks on phone: Not on file    Gets together: Not on file    Attends religious service: Not on file    Active member of club or organization: Not on file    Attends meetings of clubs or organizations: Not on file    Relationship status: Not on file  . Intimate partner violence:    Fear of current or ex partner: Not on file    Emotionally abused: Not on file    Physically abused: Not on file    Forced sexual activity: Not on file  Other Topics Concern  . Not on file  Social History Narrative  . Not on file    Family History  Problem Relation Age of Onset  . Diabetes Mother   . Hypertension Mother   . Hypertension Father   .  Diabetes Father   . Breast cancer Neg Hx   . Ovarian cancer Neg Hx   . Colon cancer Neg Hx     The following portions of the patient's history were reviewed and updated as appropriate: allergies, current medications, past family history, past medical history, past social history, past surgical history and problem list.  Review of Systems  ROS negative except as noted above. Information obtained from patient.    Objective:   BP 139/86   Pulse (!) 116   Ht 5\' 4"  (1.626 m)   Wt 187 lb 9.6 oz (85.1 kg)   LMP 09/28/2018 (Exact Date)   BMI 32.20 kg/m    CONSTITUTIONAL: Well-developed, well-nourished female in no acute distress.   PSYCHIATRIC: Normal mood and affect. Normal behavior. Normal judgment and thought content.  NEUROLGIC: Alert and oriented to person, place, and time. Normal muscle tone  coordination. No cranial nerve deficit noted.  HENT:  Normocephalic, atraumatic, External right and left ear normal. Oropharynx is clear and moist  EYES: Conjunctivae and EOM are normal. Pupils are equal and round.   NECK: Normal range of motion, supple, no masses.  Normal thyroid.   SKIN: Skin is warm and dry. No rash noted. Not diaphoretic. No erythema. No pallor.  CARDIOVASCULAR: Normal heart rate noted, regular rhythm, no murmur.  RESPIRATORY: Clear to auscultation bilaterally. Effort and breath sounds normal, no problems with respiration noted.  BREASTS: Symmetric in size. No masses, skin changes, nipple drainage, or lymphadenopathy.  ABDOMEN: Soft, normal bowel sounds, no distention noted.  No tenderness, rebound or guarding. Obese.   PELVIC: Declined.    MUSCULOSKELETAL: Normal range of motion. No tenderness.  No cyanosis, clubbing, or edema.  2+ distal pulses.  LYMPHATIC: No Axillary, Supraclavicular, or Inguinal Adenopathy.  Assessment:   Annual gynecologic examination 32 y.o.   Contraception: none   Obesity 1   Problem List Items Addressed This Visit    None    Visit Diagnoses    Well woman exam    -  Primary   Relevant Orders   CBC   TSH   Comprehensive metabolic panel   Lipid panel   Class 1 obesity without serious comorbidity with body mass index (BMI) of 32.0 to 32.9 in adult, unspecified obesity type       Relevant Orders   CBC   TSH   Comprehensive metabolic panel   Lipid panel   Back pain, unspecified back location, unspecified back pain laterality, unspecified chronicity       Dysmenorrhea          Plan:   Pap: Not needed  Labs: See orders   Routine preventative health maintenance measures emphasized: Exercise/Diet/Weight control, Tobacco Warnings, Alcohol/Substance use risks, Stress Management, Peer Pressure Issues and Safe Sex; See AVS  Declines physical therapy referral at this time  Discussed herbal options for management of  symptoms  Reviewed red flag symptoms and when to call  RTC x 1 year for ANNUAL EXAM or sooner if needed   Gunnar Bulla, CNM [facility], [facility] 10/02/18 9:41 AM
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
## 2018-10-02 NOTE — Patient Instructions (Addendum)
Back Pain, Adult Back pain is very common. The pain often gets better over time. The cause of back pain is usually not dangerous. Most people can learn to manage their back pain on their own. Follow these instructions at home: Watch your back pain for any changes. The following actions may help to lessen any pain you are feeling:  Stay active. Start with short walks on flat ground if you can. Try to walk farther each day.  Exercise regularly as told by your doctor. Exercise helps your back heal faster. It also helps avoid future injury by keeping your muscles strong and flexible.  Do not sit, drive, or stand in one place for more than 30 minutes.  Do not stay in bed. Resting more than 1-2 days can slow down your recovery.  Be careful when you bend or lift an object. Use good form when lifting: ? Bend at your knees. ? Keep the object close to your body. ? Do not twist.  Sleep on a firm mattress. Lie on your side, and bend your knees. If you lie on your back, put a pillow under your knees.  Take medicines only as told by your doctor.  Put ice on the injured area. ? Put ice in a plastic bag. ? Place a towel between your skin and the bag. ? Leave the ice on for 20 minutes, 2-3 times a day for the first 2-3 days. After that, you can switch between ice and heat packs.  Avoid feeling anxious or stressed. Find good ways to deal with stress, such as exercise.  Maintain a healthy weight. Extra weight puts stress on your back.  Contact a doctor if:  You have pain that does not go away with rest or medicine.  You have worsening pain that goes down into your legs or buttocks.  You have pain that does not get better in one week.  You have pain at night.  You lose weight.  You have a fever or chills. Get help right away if:  You cannot control when you poop (bowel movement) or pee (urinate).  Your arms or legs feel weak.  Your arms or legs lose feeling (numbness).  You feel sick  to your stomach (nauseous) or throw up (vomit).  You have belly (abdominal) pain.  You feel like you may pass out (faint). This information is not intended to replace advice given to you by your health care provider. Make sure you discuss any questions you have with your health care provider. Document Released: 04/05/2008 Document Revised: 03/25/2016 Document Reviewed: 02/19/2014 Elsevier Interactive Patient Education  2018 [place] 18-39 Years, Female Preventive care refers to lifestyle choices and visits with your health care provider that can promote health and wellness. What does preventive care include?  A yearly physical exam. This is also called an annual well check.  Dental exams once or twice a year.  Routine eye exams. Ask your health care provider how often you should have your eyes checked.  Personal lifestyle choices, including: ? Daily care of your teeth and gums. ? Regular physical activity. ? Eating a healthy diet. ? Avoiding tobacco and drug use. ? Limiting alcohol use. ? Practicing safe sex. ? Taking vitamin and mineral supplements as recommended by your health care provider. What happens during an annual well check? The services and screenings done by your health care provider during your annual well check will depend on your age, overall health, lifestyle risk factors, and family history  of disease. Counseling Your health care provider may ask you questions about your:  Alcohol use.  Tobacco use.  Drug use.  Emotional well-being.  Home and relationship well-being.  Sexual activity.  Eating habits.  Work and work Statistician.  Method of birth control.  Menstrual cycle.  Pregnancy history.  Screening You may have the following tests or measurements:  Height, weight, and BMI.  Diabetes screening. This is done by checking your blood sugar (glucose) after you have not eaten for a while (fasting).  Blood  pressure.  Lipid and cholesterol levels. These may be checked every 5 years starting at age 59.  Skin check.  Hepatitis C blood test.  Hepatitis B blood test.  Sexually transmitted disease (STD) testing.  BRCA-related cancer screening. This may be done if you have a family history of breast, ovarian, tubal, or peritoneal cancers.  Pelvic exam and Pap test. This may be done every 3 years starting at age 57. Starting at age 46, this may be done every 5 years if you have a Pap test in combination with an HPV test.  Discuss your test results, treatment options, and if necessary, the need for more tests with your health care provider. Vaccines Your health care provider may recommend certain vaccines, such as:  Influenza vaccine. This is recommended every year.  Tetanus, diphtheria, and acellular pertussis (Tdap, Td) vaccine. You may need a Td booster every 10 years.  Varicella vaccine. You may need this if you have not been vaccinated.  HPV vaccine. If you are 42 or younger, you may need three doses over 6 months.  Measles, mumps, and rubella (MMR) vaccine. You may need at least one dose of MMR. You may also need a second dose.  Pneumococcal 13-valent conjugate (PCV13) vaccine. You may need this if you have certain conditions and were not previously vaccinated.  Pneumococcal polysaccharide (PPSV23) vaccine. You may need one or two doses if you smoke cigarettes or if you have certain conditions.  Meningococcal vaccine. One dose is recommended if you are age 87-21 years and a first-year college student living in a residence hall, or if you have one of several medical conditions. You may also need additional booster doses.  Hepatitis A vaccine. You may need this if you have certain conditions or if you travel or work in places where you may be exposed to hepatitis A.  Hepatitis B vaccine. You may need this if you have certain conditions or if you travel or work in places where you may  be exposed to hepatitis B.  Haemophilus influenzae type b (Hib) vaccine. You may need this if you have certain risk factors.  Talk to your health care provider about which screenings and vaccines you need and how often you need them. This information is not intended to replace advice given to you by your health care provider. Make sure you discuss any questions you have with your health care provider. Document Released: 12/14/2001 Document Revised: 07/07/2016 Document Reviewed: 08/19/2015 Elsevier Interactive Patient Education  2018 [place].  Dysmenorrhea Dysmenorrhea means painful cramps during your period (menstrual period). You will have pain in your lower belly (abdomen). The pain is caused by the tightening (contracting) of the muscles of the womb (uterus). The pain may be mild or very bad. With this condition, you may:  Have a headache.  Feel sick to your stomach (nauseous).  Throw up (vomit).  Have lower back pain.  Follow these instructions at home: Helping pain and cramping  Put heat on your lower back or belly when you have pain or cramps. Use the heat source that your doctor tells you to use. ? Place a towel between your skin and the heat. ? Leave the heat on for 20-30 minutes. ? Remove the heat if your skin turns bright red. This is especially important if you cannot feel pain, heat, or cold. ? Do not have a heating pad on during sleep.  Do aerobic exercises. These include walking, swimming, or biking. These may help with cramps.  Massage your lower back or belly. This may help lessen pain. General instructions  Take over-the-counter and prescription medicines only as told by your doctor.  Do not drive or use heavy machinery while taking prescription pain medicine.  Avoid alcohol and caffeine during and right before your period. These can make cramps worse.  Do not use any products that have nicotine or tobacco. These include cigarettes and e-cigarettes. If  you need help quitting, ask your doctor.  Keep all follow-up visits as told by your doctor. This is important. Contact a doctor if:  You have pain that gets worse.  You have pain that does not get better with medicine.  You have pain during sex.  You feel sick to your stomach or you throw up during your period, and medicine does not help. Get help right away if:  You pass out (faint). Summary  Dysmenorrhea means painful cramps during your period (menstrual period).  Put heat on your lower back or belly when you have pain or cramps.  Do exercises like walking, swimming, or biking to help with cramps.  Contact a doctor if you have pain during sex. This information is not intended to replace advice given to you by your health care provider. Make sure you discuss any questions you have with your health care provider. Document Released: 01/14/2009 Document Revised: 11/04/2016 Document Reviewed: 11/04/2016 Elsevier Interactive Patient Education  2017 [employer].

## 2018-10-03 LAB — COMPREHENSIVE METABOLIC PANEL
ALBUMIN: 4.2 g/dL (ref 3.5–5.5)
ALT: 9 IU/L (ref 0–32)
AST: 19 IU/L (ref 0–40)
Albumin/Globulin Ratio: 1.5 (ref 1.2–2.2)
Alkaline Phosphatase: 95 IU/L (ref 39–117)
BUN / CREAT RATIO: 8 — AB (ref 9–23)
BUN: 6 mg/dL (ref 6–20)
Bilirubin Total: <0.2 mg/dL (ref 0.0–1.2)
CALCIUM: 9 mg/dL (ref 8.7–10.2)
CO2: 19 mmol/L — AB (ref 20–29)
Chloride: 101 mmol/L (ref 96–106)
Creatinine, Ser: 0.71 mg/dL (ref 0.57–1.00)
GFR, EST AFRICAN AMERICAN: 129 mL/min/{1.73_m2} (ref >59)
GFR, EST NON AFRICAN AMERICAN: 111 mL/min/{1.73_m2} (ref >59)
GLOBULIN, TOTAL: 2.8 g/dL (ref 1.5–4.5)
GLUCOSE: 54 mg/dL — AB (ref 65–99)
Potassium: 5.2 mmol/L (ref 3.5–5.2)
Sodium: 140 mmol/L (ref 134–144)
TOTAL PROTEIN: 7 g/dL (ref 6.0–8.5)

## 2018-10-03 LAB — CBC
HEMATOCRIT: 25.6 % — AB (ref 34.0–46.6)
HEMOGLOBIN: 6.9 g/dL — AB (ref 11.1–15.9)
MCH: 16.3 pg — ABNORMAL LOW (ref 26.6–33.0)
MCHC: 27 g/dL — ABNORMAL LOW (ref 31.5–35.7)
MCV: 61 fL — AB (ref 79–97)
Platelets: 389 10*3/uL (ref 150–450)
RBC: 4.23 x10E6/uL (ref 3.77–5.28)
RDW: 21.3 % — ABNORMAL HIGH (ref 12.3–15.4)
WBC: 6 10*3/uL (ref 3.4–10.8)

## 2018-10-03 LAB — LIPID PANEL
Chol/HDL Ratio: 3.6 ratio (ref 0.0–4.4)
Cholesterol, Total: 170 mg/dL (ref 100–199)
HDL: 47 mg/dL (ref >39)
LDL CALC: 100 mg/dL — AB (ref 0–99)
Triglycerides: 115 mg/dL (ref 0–149)
VLDL CHOLESTEROL CAL: 23 mg/dL (ref 5–40)

## 2018-10-03 LAB — TSH: TSH: 1.36 u[IU]/mL (ref 0.450–4.500)

## 2018-10-06 ENCOUNTER — Other Ambulatory Visit: Payer: Self-pay | Admitting: Certified Nurse Midwife

## 2018-10-06 MED ORDER — FUSION PLUS PO CAPS: 1.0000 | ORAL_CAPSULE | Freq: Every day | ORAL | 1 refills | Status: DC

## 2018-10-09 ENCOUNTER — Telehealth: Payer: Self-pay | Admitting: Certified Nurse Midwife

## 2018-10-09 NOTE — Telephone Encounter (Signed)
Patient called with questions regarding her glucose results. She would like a call back. Thanks

## 2018-10-16 NOTE — Telephone Encounter (Signed)
LMTRC

## 2018-10-17 NOTE — Telephone Encounter (Signed)
Spoke with patient, patient wondering what else she can do to increase hemoglobin.  Advised patient to continue to take iron supplement and increase foods that are rich in iron into her diet.  Patient verbalized understanding.

## 2019-01-23 ENCOUNTER — Telehealth: Payer: Self-pay | Admitting: Obstetrics and Gynecology

## 2019-01-23 ENCOUNTER — Other Ambulatory Visit: Payer: Self-pay

## 2019-01-23 ENCOUNTER — Encounter: Payer: Self-pay | Admitting: Obstetrics & Gynecology

## 2019-01-23 ENCOUNTER — Ambulatory Visit (INDEPENDENT_AMBULATORY_CARE_PROVIDER_SITE_OTHER): Payer: PRIVATE HEALTH INSURANCE | Admitting: Obstetrics & Gynecology

## 2019-01-23 DIAGNOSIS — B9689 Other specified bacterial agents as the cause of diseases classified elsewhere: Secondary | ICD-10-CM

## 2019-01-23 DIAGNOSIS — N898 Other specified noninflammatory disorders of vagina: Secondary | ICD-10-CM

## 2019-01-23 DIAGNOSIS — N760 Acute vaginitis: Secondary | ICD-10-CM | POA: Diagnosis not present

## 2019-01-23 DIAGNOSIS — B3731 Acute candidiasis of vulva and vagina: Secondary | ICD-10-CM

## 2019-01-23 DIAGNOSIS — B373 Candidiasis of vulva and vagina: Secondary | ICD-10-CM | POA: Diagnosis not present

## 2019-01-23 MED ORDER — METRONIDAZOLE 0.75 % VA GEL: 1.0000 | Freq: Every day | VAGINAL | 0 refills | Status: AC

## 2019-01-23 MED ORDER — FLUCONAZOLE 150 MG PO TABS: 150.0000 mg | ORAL_TABLET | Freq: Once | ORAL | 3 refills | Status: AC

## 2019-01-23 NOTE — Telephone Encounter (Signed)
Wants to sched appt with Dr. Tiburcio Pea. Having increased d/c without itch/odor. No recent abx use. Hx of BV and yeast in past.Takes probiotics with sx relief usually but not helping now. Will send msg to Dr. Tiburcio Pea to see if can be handled via phone vs office appt.

## 2019-01-23 NOTE — Progress Notes (Signed)
Virtual Visit via Telephone Note  I connected with Tina Simon on 01/23/19 at 11:00 AM EDT by telephone and verified that I am speaking with the correct person using two identifiers.   I discussed the limitations, risks, security and privacy concerns of performing an evaluation and management service by telephone and the availability of in person appointments. I also discussed with the patient that there may be a patient responsible charge related to this service. The patient expressed understanding and agreed to proceed. She was at home and I was in my office.  History of Present Illness: Pt is a 33 yo G0 AA F with chronic vaginal sx's of concern.  Vaginitis: Patient complains of an abnormal vaginal discharge for several months. Vaginal symptoms include discharge described as white, curd-like, odorless and without itch, burn, swelling. Vulvar symptoms include none.STI Risk: Very low risk of STD exposureDischarge described as: as above; it occurs off and on over the past year, unsure why this keeps happening.  Has been treated for BV, Yeast in past.  Treated w Clindamycin in Aug 2019.  Has taken Augmentin in past.  OTC meds include probiotics and monistat. Other associated symptoms: none.Menstrual pattern: She had been bleeding regularly. Contraception: none. Has annual scheduled in April.  PMHx: She  has a past medical history of Anemia, Environmental allergies, and HPV in female. Also,  has a past surgical history that includes Bartholin cyst marsupialization., family history includes Diabetes in her father and mother; Hypertension in her father and mother.,  reports that she has never smoked. She has never used smokeless tobacco. She reports that she does not drink alcohol or use drugs. She has a current medication list which includes the following prescription(s): fusion plus, loratadine, multiple vitamins-minerals, and probiotic product. NKDA.   Observations/Objective: No exam today, due to  telephone eVisit due to [facility] virus restriction on elective visits and procedures.  Prior visits reviewed along with ultrasounds/labs as indicated.  Assessment and Plan:  Bacterial vaginosis      Vaginal discharge       Candida vaginitis        Will plan to treat by phone without culture or wet mount at his time, and see in future for testing (even if asymptomatic) Rx Diflucan, Metrogel  Follow Up Instructions: Keep April appt PRN if sx's worsen   I discussed the assessment and treatment plan with the patient. The patient was provided an opportunity to ask questions and all were answered. The patient agreed with the plan and demonstrated an understanding of the instructions.   The patient was advised to call back or seek an in-person evaluation if the symptoms worsen or if the condition fails to improve as anticipated.  I provided 12 minutes of non-face-to-face time during this encounter.   Letitia Libra, MD [facility], [facility] 01/23/2019  11:11 AM

## 2019-01-23 NOTE — Telephone Encounter (Signed)
Patient is schedule 01/23/19 with [facility] for telephone office appointment per patient request.

## 2019-02-27 ENCOUNTER — Ambulatory Visit: Payer: PRIVATE HEALTH INSURANCE | Admitting: Obstetrics & Gynecology

## 2019-03-27 ENCOUNTER — Telehealth: Payer: Self-pay | Admitting: Certified Nurse Midwife

## 2019-03-27 DIAGNOSIS — Z8739 Personal history of other diseases of the musculoskeletal system and connective tissue: Secondary | ICD-10-CM

## 2019-03-27 NOTE — Telephone Encounter (Signed)
Please see if patient would prefer visit with Physical Therapy or Sports Medicine. Then place desired referral. Thanks, JML

## 2019-03-27 NOTE — Addendum Note (Signed)
Addended by: Ranee Gosselin J on: 03/27/2019 11:18 AM   Modules accepted: Orders

## 2019-03-27 NOTE — Telephone Encounter (Signed)
Patient is requesting referral for back pain.  Do I order that?  Let me know if I need to do anything, thanks.

## 2019-03-27 NOTE — Telephone Encounter (Signed)
Patient called back, wants to do physical therapy.  She prefers to stay in the [place] area and possibly at [facility].  Patient aware referral will be sent in.

## 2019-03-27 NOTE — Telephone Encounter (Signed)
LMTRC
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
## 2019-06-08 ENCOUNTER — Ambulatory Visit (INDEPENDENT_AMBULATORY_CARE_PROVIDER_SITE_OTHER): Payer: PRIVATE HEALTH INSURANCE | Admitting: Obstetrics & Gynecology

## 2019-06-08 ENCOUNTER — Other Ambulatory Visit (HOSPITAL_COMMUNITY)
Admission: RE | Admit: 2019-06-08 | Discharge: 2019-06-08 | Disposition: A | Discharge status: Home / Self Care | Payer: PRIVATE HEALTH INSURANCE | Source: Ambulatory Visit | Attending: Obstetrics & Gynecology | Admitting: Obstetrics & Gynecology

## 2019-06-08 ENCOUNTER — Other Ambulatory Visit: Payer: Self-pay

## 2019-06-08 ENCOUNTER — Encounter: Payer: Self-pay | Admitting: Obstetrics & Gynecology

## 2019-06-08 VITALS — BP 140/80 | Ht 63.0 in | Wt 183.0 lb

## 2019-06-08 DIAGNOSIS — N898 Other specified noninflammatory disorders of vagina: Secondary | ICD-10-CM | POA: Diagnosis present

## 2019-06-08 DIAGNOSIS — Z30011 Encounter for initial prescription of contraceptive pills: Secondary | ICD-10-CM

## 2019-06-08 DIAGNOSIS — Z01419 Encounter for gynecological examination (general) (routine) without abnormal findings: Secondary | ICD-10-CM | POA: Diagnosis not present

## 2019-06-08 MED ORDER — NORGESTIM-ETH ESTRAD TRIPHASIC 0.18/0.215/0.25 MG-35 MCG PO TABS: 1.0000 | ORAL_TABLET | Freq: Every day | ORAL | 11 refills | Status: DC

## 2019-06-08 NOTE — Progress Notes (Signed)
HPI:      [name] is a 33 y.o. G0P0000 who LMP was Patient's last menstrual period was 05/16/2019., she presents today for her annual examination. The patient has no complaints today. The patient is sexually active. Her last pap: approximate date 2018 and was normal and last mammogram: patient has never had a mammogram. The patient does perform self breast exams.  There is no notable family history of breast or ovarian cancer in her family.  The patient has regular exercise: yes.  The patient denies current symptoms of depression.  Cycles reg but heavy and 7 days.  Prior use pill had some side effects years ago.  Would like to try again.  GYN History: Contraception: condoms  PMHx: Past Medical History:  Diagnosis Date  . Anemia   . Environmental allergies   . HPV in female    Past Surgical History:  Procedure Laterality Date  . BARTHOLIN CYST MARSUPIALIZATION     Family History  Problem Relation Age of Onset  . Diabetes Mother   . Hypertension Mother   . Hypertension Father   . Diabetes Father   . Breast cancer Neg Hx   . Ovarian cancer Neg Hx   . Colon cancer Neg Hx    Social History   Tobacco Use  . Smoking status: Never Smoker  . Smokeless tobacco: Never Used  Substance Use Topics  . Alcohol use: No  . Drug use: No    Current Outpatient Medications:  .  Iron-FA-B Cmp-C-Biot-Probiotic (FUSION PLUS) CAPS, Take 1 capsule by mouth daily., Disp: 60 capsule, Rfl: 1 .  loratadine (CLARITIN) 10 MG tablet, Take 10 mg by mouth daily., Disp: , Rfl:  .  Multiple Vitamins-Minerals (MULTIVITAMIN ADULT EXTRA C PO), Take by mouth., Disp: , Rfl:  .  Probiotic Product (PROBIOTIC-10 PO), Take by mouth., Disp: , Rfl:  .  Norgestimate-Ethinyl Estradiol Triphasic (TRI-SPRINTEC) 0.18/0.215/0.25 MG-35 MCG tablet, Take 1 tablet by mouth daily., Disp: 28 tablet, Rfl: 11 Allergies: Patient has no known allergies.  Review of Systems  Constitutional: Negative for chills, fever and  malaise/fatigue.  HENT: Negative for congestion, sinus pain and sore throat.   Eyes: Negative for blurred vision and pain.  Respiratory: Negative for cough and wheezing.   Cardiovascular: Negative for chest pain and leg swelling.  Gastrointestinal: Negative for abdominal pain, constipation, diarrhea, heartburn, nausea and vomiting.  Genitourinary: Negative for dysuria, frequency, hematuria and urgency.  Musculoskeletal: Negative for back pain, joint pain, myalgias and neck pain.  Skin: Negative for itching and rash.  Neurological: Negative for dizziness, tremors and weakness.  Endo/Heme/Allergies: Does not bruise/bleed easily.  Psychiatric/Behavioral: Negative for depression. The patient is not nervous/anxious and does not have insomnia.     Objective: BP 140/80   Ht 5\' 3"  (1.6 m)   Wt 183 lb (83 kg)   LMP 05/16/2019   BMI 32.42 kg/m   Filed Weights   06/08/19 0858  Weight: 183 lb (83 kg)   Body mass index is 32.42 kg/m. Physical Exam Constitutional:      General: She is not in acute distress.    Appearance: She is well-developed.  Genitourinary:     Pelvic exam was performed with patient supine.     Vagina, uterus and rectum normal.     No lesions in the vagina.     No vaginal bleeding.     No cervical motion tenderness, friability, lesion or polyp.     Uterus is mobile.  Uterus is not enlarged.     No uterine mass detected.    Uterus is midaxial.     No right or left adnexal mass present.     Right adnexa not tender.     Left adnexa not tender.  HENT:     Head: Normocephalic and atraumatic. No laceration.     Right Ear: Hearing normal.     Left Ear: Hearing normal.     Mouth/Throat:     Pharynx: Uvula midline.  Eyes:     Pupils: Pupils are equal, round, and reactive to light.  Neck:     Musculoskeletal: Normal range of motion and neck supple.     Thyroid: No thyromegaly.  Cardiovascular:     Rate and Rhythm: Normal rate and regular rhythm.     Heart  sounds: No murmur. No friction rub. No gallop.   Pulmonary:     Effort: Pulmonary effort is normal. No respiratory distress.     Breath sounds: Normal breath sounds. No wheezing.  Chest:     Breasts:        Right: No mass, skin change or tenderness.        Left: No mass, skin change or tenderness.  Abdominal:     General: Bowel sounds are normal. There is no distension.     Palpations: Abdomen is soft.     Tenderness: There is no abdominal tenderness. There is no rebound.  Musculoskeletal: Normal range of motion.  Neurological:     Mental Status: She is alert and oriented to person, place, and time.     Cranial Nerves: No cranial nerve deficit.  Skin:    General: Skin is warm and dry.  Psychiatric:        Judgment: Judgment normal.  Vitals signs reviewed.     Assessment:  ANNUAL EXAM 1. Women's annual routine gynecological examination   2. Vaginal discharge   3. Encounter for initial prescription of contraceptive pills      Screening Plan:            1.  Cervical Screening-  DNA probe for chlamydia and GC obtained, as well as BV as she has chronic hx PAP next year  2. Breast screening- Exam annually and mammogram>40 planned   3. Colonoscopy every 10 years, Hemoccult testing - after age 35  4. Labs managed by PCP  5. Counseling for contraception: oral contraceptives (estrogen/progesterone)  OCPs The risks /benefits of OCPs have been explained to the patient in detail.  Product literature has been given to her.  I have instructed her in the use of OCPs and have given her literature reinforcing this information.  I have explained to the patient that OCPs are not as effective for birth control during the first month of use, and that another form of contraception should be used during this time.  Both first-day start and Sunday start have been explained.  The risks and benefits of each was discussed.  She has been made aware of  the fact that other medications may affect the  efficacy of OCPs.  I have answered all of her questions, and I believe that she has an understanding of the effectiveness and use of OCPs.  6. Vaginal discharge - Monitor sx's.  OTC meds.  Hormones may change sx's (OCPs) - Cervicovaginal ancillary only   7. Encounter for initial prescription of contraceptive pills - restart OCPs for contraception as well as period control.  All other options counseled. - Norgestimate-Ethinyl Estradiol  Triphasic (TRI-SPRINTEC) 0.18/0.215/0.25 MG-35 MCG tablet; Take 1 tablet by mouth daily.  Dispense: 28 tablet; Refill: 11    F/U  Return in about 1 year (around 06/07/2020) for Annual.  Annamarie MajorPaul Cayce Paschal, MD, Merlinda FrederickFACOG [facility], [facility] 06/08/2019  11:16 AM

## 2019-06-08 NOTE — Patient Instructions (Signed)
Hormonal Contraception Information Hormonal contraception is a type of birth control that uses hormones to prevent pregnancy. It usually involves a combination of the hormones estrogen and progesterone or only the hormone progesterone. Hormonal contraception works in these ways:  It thickens the mucus in the cervix, making it harder for sperm to enter the uterus.  It changes the lining of the uterus, making it harder for an egg to implant.  It may stop the ovaries from releasing eggs (ovulation). Some women who take hormonal contraceptives that contain only progesterone may continue to ovulate. Hormonal contraception cannot prevent sexually transmitted infections (STIs). Pregnancy may still occur. Estrogen and progesterone contraceptives Contraceptives that use a combination of estrogen and progesterone are available in these forms:  Pill. Pills come in different combinations of hormones. They must be taken at the same time each day. Pills can affect your period, causing you to get your period once every three months or not at all.  Patch. The patch must be worn on the lower abdomen for three weeks and then removed on the fourth.  Vaginal ring. The ring is placed in the vagina and left there for three weeks. It is then removed for one week. Progesterone contraceptives Contraceptives that use progesterone only are available in these forms:  Pill. Pills should be taken every day of the cycle.  Intrauterine device (IUD). This device is inserted into the uterus and removed or replaced every five years or sooner.  Implant. Plastic rods are placed under the skin of the upper arm. They are removed or replaced every three years or sooner.  Injection. The injection is given once every 90 days. What are the side effects? The side effects of estrogen and progesterone contraceptives include:  Nausea.  Headaches.  Breast tenderness.  Bleeding or spotting between menstrual cycles.  High blood  pressure (rare).  Strokes, heart attacks, or blood clots (rare) Side effects of progesterone-only contraceptives include:  Nausea.  Headaches.  Breast tenderness.  Unpredictable menstrual bleeding.  High blood pressure (rare). Talk to your health care provider about what side effects may affect you. Where to find more information  Ask your health care provider for more information and resources about hormonal contraception.  U.S[place] Department of Health and Human Services Office on Women's Health: www.womenshealth.gov Questions to ask:  What type of hormonal contraception is right for me?  How long should I plan to use hormonal contraception?  What are the side effects of the hormonal contraception method I choose?  How can I prevent STIs while using hormonal contraception? Contact a health care provider if:  You start taking hormonal contraceptives and you develop persistent or severe side effects. Summary  Estrogen and progesterone are hormones used in many forms of birth control.  Talk to your health care provider about what side effects may affect you.  Hormonal contraception cannot prevent sexually transmitted infections (STIs).  Ask your health care provider for more information and resources about hormonal contraception. This information is not intended to replace advice given to you by your health care provider. Make sure you discuss any questions you have with your health care provider. Document Released: 11/07/2007 Document Revised: 02/12/2019 Document Reviewed: 09/17/2016 Elsevier Patient Education  2020 Elsevier Inc.  

## 2019-06-14 ENCOUNTER — Other Ambulatory Visit: Payer: Self-pay | Admitting: Obstetrics & Gynecology

## 2019-06-14 LAB — CERVICOVAGINAL ANCILLARY ONLY
Bacterial vaginitis: NEGATIVE
Candida vaginitis: POSITIVE — AB
Chlamydia: NEGATIVE
Neisseria Gonorrhea: NEGATIVE

## 2019-06-14 MED ORDER — FLUCONAZOLE 150 MG PO TABS: 150.0000 mg | ORAL_TABLET | Freq: Once | ORAL | 3 refills | Status: AC

## 2019-06-25 ENCOUNTER — Other Ambulatory Visit: Payer: Self-pay

## 2019-06-25 ENCOUNTER — Other Ambulatory Visit: Payer: Self-pay | Admitting: Obstetrics & Gynecology

## 2019-06-25 ENCOUNTER — Other Ambulatory Visit: Payer: PRIVATE HEALTH INSURANCE

## 2019-06-25 DIAGNOSIS — E669 Obesity, unspecified: Secondary | ICD-10-CM

## 2019-06-25 DIAGNOSIS — Z6832 Body mass index (BMI) 32.0-32.9, adult: Secondary | ICD-10-CM

## 2019-06-26 ENCOUNTER — Telehealth: Payer: Self-pay

## 2019-06-26 LAB — LIPID PANEL
Chol/HDL Ratio: 4 ratio (ref 0.0–4.4)
Cholesterol, Total: 206 mg/dL — ABNORMAL HIGH (ref 100–199)
HDL: 51 mg/dL (ref >39)
LDL Calculated: 130 mg/dL — ABNORMAL HIGH (ref 0–99)
Triglycerides: 123 mg/dL (ref 0–149)
VLDL Cholesterol Cal: 25 mg/dL (ref 5–40)

## 2019-06-26 LAB — GLUCOSE, FASTING: Glucose, Plasma: 100 mg/dL — ABNORMAL HIGH (ref 65–99)

## 2019-06-26 NOTE — Telephone Encounter (Signed)
Do you have this?

## 2019-06-26 NOTE — Telephone Encounter (Signed)
Pt called stating she was here yesterday and had to get a paper filled out for her insurance company however [employer] is stating the cholesterol part of the form was not faxed over, she is requesting that this be filled out and faxed back to her insurance company. Thank you

## 2019-06-27 NOTE — Telephone Encounter (Signed)
Done

## 2019-10-03 ENCOUNTER — Telehealth: Payer: PRIVATE HEALTH INSURANCE | Admitting: Family

## 2019-10-03 DIAGNOSIS — B9689 Other specified bacterial agents as the cause of diseases classified elsewhere: Secondary | ICD-10-CM | POA: Diagnosis not present

## 2019-10-03 DIAGNOSIS — N760 Acute vaginitis: Secondary | ICD-10-CM | POA: Diagnosis not present

## 2019-10-03 MED ORDER — METRONIDAZOLE 500 MG PO TABS: 500.0000 mg | ORAL_TABLET | Freq: Two times a day (BID) | ORAL | 0 refills | Status: DC

## 2019-10-03 NOTE — Progress Notes (Signed)

## 2019-10-04 MED ORDER — CLINDAMYCIN PHOSPHATE 2 % VA CREA: 1.0000 | TOPICAL_CREAM | Freq: Every day | VAGINAL | 0 refills | Status: AC

## 2019-10-04 NOTE — Addendum Note (Signed)
Addended by: Rodell Perna A on: 10/04/2019 08:09 AM   Modules accepted: Orders

## 2019-11-21 ENCOUNTER — Encounter: Payer: PRIVATE HEALTH INSURANCE | Admitting: Obstetrics and Gynecology

## 2019-12-06 ENCOUNTER — Telehealth: Payer: Self-pay | Admitting: Certified Nurse Midwife

## 2019-12-06 NOTE — Telephone Encounter (Signed)
Pt called in with viginal irritation and wanted an appt with michelle but michelle is out of the office till 2/15 and she has no open appointments. I checked annies schu but she was at 100%. I told the pt I could send a message back for her to annie but she  said she was going to get in to a emergency clinic

## 2020-02-19 NOTE — Progress Notes (Signed)
Patient, No Pcp Per   Chief Complaint  Patient presents with  . Vaginal Bleeding    for the last two weeks, only when wipes, denies uti sx    HPI:      Ms. MARAH PARK is a 34 y.o. G0P0000 who LMP was Patient's last menstrual period was 01/04/2020 (exact date)., presents today for AUB this cycle only. Menses are monthly, last 7 days, mod flow on heavy days, mod dysmen, improved with regular ibup use. Pt usually has a few days mid cycle spotting around ovulation, but bleeding has persisted for over a wk this cycle. Bleeding is still very light, usually with wiping only. Having mild cramping too. 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 Hasn't noticed change in wt but clothes feel looser.   Pt stopped OCPs last yr. She is not sex active for a few months, no new partners. Neg STD tesitng 8/20 at annual and [facility].    Past Medical History:  Diagnosis Date  . Anemia   . Environmental allergies   . HPV in female     Past Surgical History:  Procedure Laterality Date  . BARTHOLIN CYST MARSUPIALIZATION      Family History  Problem Relation Age of Onset  . Diabetes Mother   . Hypertension Mother   . Hypertension Father   . Diabetes Father   . Breast cancer Neg Hx   . Ovarian cancer Neg Hx   . Colon cancer Neg Hx     Social History   Socioeconomic History  . Marital status: Single    Spouse name: Not on file  . Number of children: Not on file  . Years of education: Not on file  . Highest education level: Not on file  Occupational History  . Not on file  Tobacco Use  . Smoking status: Never Smoker  . Smokeless tobacco: Never Used  Substance and Sexual Activity  . Alcohol use: No  . Drug use: No  . Sexual activity: Not Currently    Birth control/protection: None  Other Topics Concern  . Not on file  Social History Narrative  . Not on file   Social Determinants of Health    Financial Resource Strain:   . Difficulty of Paying Living Expenses:   Food Insecurity:   . Worried About Programme researcher, broadcasting/film/video in the Last Year:   . Barista in the Last Year:   Transportation Needs:   . Freight forwarder (Medical):   Marland Kitchen Lack of Transportation (Non-Medical):   Physical Activity:   . Days of Exercise per Week:   . Minutes of Exercise per Session:   Stress:   . Feeling of Stress :   Social Connections:   . Frequency of Communication with Friends and Family:   . Frequency of Social Gatherings with Friends and Family:   . Attends Religious Services:   . Active Member of Clubs or Organizations:   . Attends Banker Meetings:   Marland Kitchen Marital Status:   Intimate Partner Violence:   . Fear of Current or Ex-Partner:   . Emotionally Abused:   Marland Kitchen Physically Abused:   . Sexually Abused:     Outpatient Medications Prior to Visit  Medication Sig Dispense Refill  . Multiple Vitamins-Minerals (MULTIVITAMIN ADULT EXTRA C PO) Take by mouth.    . Probiotic Product (PROBIOTIC-10 PO) Take by mouth.    Marland Kitchen  Iron-FA-B Cmp-C-Biot-Probiotic (FUSION PLUS) CAPS Take 1 capsule by mouth daily. 60 capsule 1  . loratadine (CLARITIN) 10 MG tablet Take 10 mg by mouth daily.    . metroNIDAZOLE (FLAGYL) 500 MG tablet Take 1 tablet (500 mg total) by mouth 2 (two) times daily. 14 tablet 0  . Norgestimate-Ethinyl Estradiol Triphasic (TRI-SPRINTEC) 0.18/0.215/0.25 MG-35 MCG tablet Take 1 tablet by mouth daily. 28 tablet 11   No facility-administered medications prior to visit.      ROS:  Review of Systems  Constitutional: Negative for fever.  Gastrointestinal: Negative for blood in stool, constipation, diarrhea, nausea and vomiting.  Genitourinary: Positive for vaginal bleeding. Negative for dyspareunia, dysuria, flank pain, frequency, hematuria, urgency, vaginal discharge and vaginal pain.  Musculoskeletal: Negative for back pain.  Skin: Negative for rash.   BREAST: No  symptoms   OBJECTIVE:   Vitals:  BP 138/80   Ht 5\' 3"  (1.6 m)   Wt 192 lb (87.1 kg)   LMP 01/04/2020 (Exact Date)   BMI 34.01 kg/m   Physical Exam Vitals reviewed.  Constitutional:      Appearance: She is well-developed.  Pulmonary:     Effort: Pulmonary effort is normal.  Genitourinary:    General: Normal vulva.     Pubic Area: No rash.      Labia:        Right: No rash, tenderness or lesion.        Left: No rash, tenderness or lesion.      Vagina: Bleeding present. No vaginal discharge, erythema or tenderness.     Cervix: Normal.     Uterus: Normal. Not enlarged and not tender.      Adnexa: Right adnexa normal and left adnexa normal.       Right: No mass or tenderness.         Left: No mass or tenderness.       Comments: BROWN D/C VAGINALLY Musculoskeletal:        General: Normal range of motion.     Cervical back: Normal range of motion.  Skin:    General: Skin is warm and dry.  Neurological:     General: No focal deficit present.     Mental Status: She is alert and oriented to person, place, and time.  Psychiatric:        Mood and Affect: Mood normal.        Behavior: Behavior normal.        Thought Content: Thought content normal.        Judgment: Judgment normal.     Assessment/Plan: Abnormal uterine bleeding (AUB)--This cycle only. Under increased stress/new exercise routine. Rule out STD. If neg, follow cycles. If sx persist/recur, will check labs and GYN u/s. Pt to f/u prn.   Screening for STD (sexually transmitted disease) - Plan: Cervicovaginal ancillary only     Return if symptoms worsen or fail to improve.  Mitcheal Sweetin B. Ndrew Creason, PA-C 02/20/2020 11:22 AM

## 2020-02-20 ENCOUNTER — Other Ambulatory Visit (HOSPITAL_COMMUNITY)
Admission: RE | Admit: 2020-02-20 | Discharge: 2020-02-20 | Disposition: A | Discharge status: Home / Self Care | Payer: PRIVATE HEALTH INSURANCE | Source: Ambulatory Visit | Attending: Obstetrics and Gynecology | Admitting: Obstetrics and Gynecology

## 2020-02-20 ENCOUNTER — Other Ambulatory Visit: Payer: Self-pay

## 2020-02-20 ENCOUNTER — Encounter: Payer: Self-pay | Admitting: Obstetrics and Gynecology

## 2020-02-20 ENCOUNTER — Ambulatory Visit (INDEPENDENT_AMBULATORY_CARE_PROVIDER_SITE_OTHER): Payer: PRIVATE HEALTH INSURANCE | Admitting: Obstetrics and Gynecology

## 2020-02-20 VITALS — BP 138/80 | Ht 63.0 in | Wt 192.0 lb

## 2020-02-20 DIAGNOSIS — Z113 Encounter for screening for infections with a predominantly sexual mode of transmission: Secondary | ICD-10-CM | POA: Insufficient documentation

## 2020-02-20 DIAGNOSIS — N939 Abnormal uterine and vaginal bleeding, unspecified: Secondary | ICD-10-CM | POA: Diagnosis not present

## 2020-02-20 NOTE — Patient Instructions (Signed)
I value your feedback and entrusting us with your care. If you get a [facility] patient survey, I would appreciate you taking the time to let us know about your experience today. Thank you!  As of October 11, 2019, your lab results will be released to your MyChart immediately, before I even have a chance to see them. Please give me time to review them and contact you if there are any abnormalities. Thank you for your patience.  

## 2020-02-21 LAB — CERVICOVAGINAL ANCILLARY ONLY
Chlamydia: NEGATIVE
Comment: NEGATIVE
Comment: NORMAL
Neisseria Gonorrhea: NEGATIVE

## 2020-03-07 ENCOUNTER — Other Ambulatory Visit: Payer: Self-pay

## 2020-03-07 ENCOUNTER — Ambulatory Visit
Admission: EM | Admit: 2020-03-07 | Discharge: 2020-03-07 | Disposition: A | Discharge status: Home / Self Care | Payer: PRIVATE HEALTH INSURANCE

## 2020-03-07 DIAGNOSIS — H66001 Acute suppurative otitis media without spontaneous rupture of ear drum, right ear: Secondary | ICD-10-CM

## 2020-03-07 DIAGNOSIS — H6593 Unspecified nonsuppurative otitis media, bilateral: Secondary | ICD-10-CM

## 2020-03-07 MED ORDER — AMOXICILLIN-POT CLAVULANATE 875-125 MG PO TABS: 1.0000 | ORAL_TABLET | Freq: Two times a day (BID) | ORAL | 0 refills | Status: AC

## 2020-03-07 NOTE — Discharge Instructions (Signed)
It was very nice seeing you today in clinic. Thank you for entrusting me with your care.   Increase hydration. Add Sudafed to help dry up effusions (fluid behind ears). Use antibiotics as prescribed.   Make arrangements to follow up with your regular doctor in 1 week for re-evaluation if not improving.  If your symptoms/condition worsens, please seek follow up care either here or in the ER. Please remember, our [facility] providers are "right here with you" when you need [place].   Again, it was my pleasure to take care of you today. Thank you for choosing our clinic. I hope that you start to feel better quickly.   Quentin Mulling, MSN, APRN, FNP-C, CEN Advanced Practice Provider [facility]

## 2020-03-07 NOTE — ED Provider Notes (Signed)
[place], [place]   Name: Tina Simon DOB: 1985-11-14 MRN: [id number] CSN: [id number] PCP: Patient, No Pcp Per  Arrival date and time:  03/07/20 0233  Chief Complaint:  Otalgia (right)  NOTE: Prior to seeing the patient today, I have reviewed the triage nursing documentation and vital signs. Clinical staff has updated patient's PMH/PSHx, current medication list, and drug allergies/intolerances to ensure comprehensive history available to assist in medical decision making.   History:   HPI: Tina Simon is a 34 y.o. female who presents today with complaints of pain in her RIGHT ear. Pain began approximately 2 weeks ago; worsen over the last few days. She denies any associated fevers. She has a PMH (+) for allergic rhinitis for which she uses daily fexofenadine and triamcinolone nasal spray. Patient with congestion and rhinorrhea; denies SOB and wheezing. No headaches, paranasal sinus tenderness, or sore throat.  She denies forceful nose blowing. Patient has not appreciated any otorrhea. She advises that her ability to hear from the RIGHT ear has acutely changed with the onset of the pain; describes hearing as being muffled. Patient denies history of recurrent ear infections. She has never had tympanostomy tubes in the past. Patient denies being in close contact with anyone known to be ill; no one in her home has similar symptoms.   Past Medical History:  Diagnosis Date  . Anemia   . Environmental allergies   . HPV in female     Past Surgical History:  Procedure Laterality Date  . BARTHOLIN CYST MARSUPIALIZATION      Family History  Problem Relation Age of Onset  . Diabetes Mother   . Hypertension Mother   . Hypertension Father   . Diabetes Father   . Breast cancer Neg Hx   . Ovarian cancer Neg Hx   . Colon cancer Neg Hx     Social History   Tobacco Use  . Smoking status: Never Smoker  . Smokeless tobacco: Never Used  Substance Use Topics  . Alcohol use: No  . Drug use:  No    Patient Active Problem List   Diagnosis Date Noted  . History of abnormal cervical Pap smear 09/29/2017    Home Medications:    Current Meds  Medication Sig  . fexofenadine (ALLEGRA) 180 MG tablet Take 180 mg by mouth daily.  . Multiple Vitamins-Minerals (MULTIVITAMIN ADULT EXTRA C PO) Take by mouth.  . Probiotic Product (PROBIOTIC-10 PO) Take by mouth.  . triamcinolone (NASACORT ALLERGY 24HR) 55 MCG/ACT AERO nasal inhaler Place 2 sprays into the nose daily.    Allergies:   Patient has no known allergies.  Review of Systems (ROS):  Review of systems NEGATIVE unless otherwise noted in narrative H&P section.   Vital Signs: Today's Vitals   03/07/20 0958 03/07/20 1000 03/07/20 1018  BP:  (!) 142/92   Pulse:  81   Resp:  16   Temp:  98.6 F (37 C)   TempSrc:  Oral   SpO2:  98%   Weight: 180 lb (81.6 kg)    Height: 5\' 3"  (1.6 m)    PainSc: 7   7     Physical Exam: Physical Exam  Constitutional: She is oriented to person, place, and time and well-developed, well-nourished, and in no distress.  HENT:  Head: Normocephalic and atraumatic.  Right Ear: There is tenderness. Tympanic membrane is erythematous and bulging. A middle ear effusion (suppurative) is present.  Left Ear: Tympanic membrane is not erythematous and not bulging. A  middle ear effusion (mild suppurative) is present.  Nose: Rhinorrhea and nose lacerations present.  Mouth/Throat: Uvula is midline, oropharynx is clear and moist and mucous membranes are normal.  Eyes: Pupils are equal, round, and reactive to light.  Cardiovascular: Normal rate and intact distal pulses.  Pulmonary/Chest: Effort normal. No respiratory distress.  Lymphadenopathy:       Head (right side): Submandibular adenopathy present.  Neurological: She is alert and oriented to person, place, and time. Gait normal.  Skin: Skin is warm and dry. No rash noted. She is not diaphoretic.  Psychiatric: Mood, memory, affect and judgment normal.   Nursing note and vitals reviewed.   Urgent Care Treatments / Results:   No orders of the defined types were placed in this encounter.   LABS: PLEASE NOTE: all labs that were ordered this encounter are listed, however only abnormal results are displayed. Labs Reviewed - No data to display  EKG: -None  RADIOLOGY: No results found.  PROCEDURES: Procedures  MEDICATIONS RECEIVED THIS VISIT: Medications - No data to display  PERTINENT CLINICAL COURSE NOTES/UPDATES:   Initial Impression / Assessment and Plan / Urgent Care Course:  Pertinent labs & imaging results that were available during my care of the patient were personally reviewed by me and considered in my medical decision making (see lab/imaging section of note for values and interpretations).  Tina Simon is a 34 y.o. female who presents to [facility] today with complaints of Otalgia (right)  Patient is well appearing overall in clinic today. She does not appear to be in any acute distress. Presenting symptoms (see HPI) and exam as documented above. Exam consistent with AOME on the RIGHT with a milder MEE on the LEFT. Treating with a 10 day course of amoxicillin-clavulanate. Patient to continue allergy medication and nasal spray. Recommended pseudoephedrine to help with congestion and dry up ear MEEs. Reviewed supportive care; rest, hydration, and PRN use of APAP/IBU.   Discussed follow up with primary care physician in 1 week for re-evaluation. I have reviewed the follow up and strict return precautions for any new or worsening symptoms. Patient is aware of symptoms that would be deemed urgent/emergent, and would thus require further evaluation either here or in the emergency department. At the time of discharge, she verbalized understanding and consent with the discharge plan as it was reviewed with her. All questions were fielded by provider and/or clinic staff prior to patient discharge.    Final Clinical  Impressions / Urgent Care Diagnoses:   Final diagnoses:  Non-recurrent acute suppurative otitis media of right ear without spontaneous rupture of tympanic membrane  MEE (middle ear effusion), bilateral    New Prescriptions:  [place] Controlled Substance Registry consulted? Not Applicable  Meds ordered this encounter  Medications  . amoxicillin-clavulanate (AUGMENTIN) 875-125 MG tablet    Sig: Take 1 tablet by mouth 2 (two) times daily for 10 days.    Dispense:  20 tablet    Refill:  0    Recommended Follow up Care:  Patient encouraged to follow up with the following provider within the specified time frame, or sooner as dictated by the severity of her symptoms. As always, she was instructed that for any urgent/emergent care needs, she should seek care either here or in the emergency department for more immediate evaluation.  Follow-up Information    PCP In 1 week.   Why: General reassessment of symptoms if not improving        NOTE: This note was  prepared using Scientist, clinical (histocompatibility and immunogenetics) along with smaller Lobbyist. Despite my best ability to proofread, there is the potential that transcriptional errors may still occur from this process, and are completely unintentional.    Verlee Monte, NP 03/07/20 1026

## 2020-03-07 NOTE — ED Triage Notes (Signed)
Pt presents with c/o right earache for about 2 weeks. Pt reports pain around the front of her ear that radiates to the back of the ear and into her lower jaw. Pt does have seasonal allergies and nasal congestion, she takes allergy meds and nasal spray. Pt reports normal hearing, denies drainage, tooth/sinus pain. Pt also reports several episodes of lightheadedness over the past week. Pt denies fever/chills, cough or other symptoms.

## 2020-04-22 ENCOUNTER — Ambulatory Visit (INDEPENDENT_AMBULATORY_CARE_PROVIDER_SITE_OTHER): Payer: PRIVATE HEALTH INSURANCE | Admitting: Otolaryngology

## 2020-04-22 ENCOUNTER — Other Ambulatory Visit: Payer: Self-pay

## 2020-04-22 VITALS — Temp 98.1°F

## 2020-04-22 DIAGNOSIS — H9041 Sensorineural hearing loss, unilateral, right ear, with unrestricted hearing on the contralateral side: Secondary | ICD-10-CM

## 2020-04-22 DIAGNOSIS — H6981 Other specified disorders of Eustachian tube, right ear: Secondary | ICD-10-CM

## 2020-04-22 NOTE — Progress Notes (Signed)
HPI: Tina Simon is a 34 y.o. female who presents is referred by PCP for evaluation of right ear infection.  This initially began on May to 6 with pain and pressure in her ear.  She did not notice that much hearing problems initially.  She did complain of pressure in the right ear as well as a sense of pulse in her right ear.  Pain comes and goes.  She has some postnasal drainage that was causing a cough at that time patient does have history of allergies.  Patient presently is on Zyrtec and Flonase. We she initially developed ear infection she was treated with Augmentin for 10 days as well as Sudafed. She was later placed on prednisone 10 mg daily for a week and this seemed to help more..  Past Medical History:  Diagnosis Date  . Anemia   . Environmental allergies   . HPV in female    Past Surgical History:  Procedure Laterality Date  . BARTHOLIN CYST MARSUPIALIZATION     Social History   Socioeconomic History  . Marital status: Single    Spouse name: Not on file  . Number of children: Not on file  . Years of education: Not on file  . Highest education level: Not on file  Occupational History  . Not on file  Tobacco Use  . Smoking status: Never Smoker  . Smokeless tobacco: Never Used  Vaping Use  . Vaping Use: Never used  Substance and Sexual Activity  . Alcohol use: No  . Drug use: No  . Sexual activity: Not Currently    Birth control/protection: None  Other Topics Concern  . Not on file  Social History Narrative  . Not on file   Social Determinants of Health   Financial Resource Strain:   . Difficulty of Paying Living Expenses:   Food Insecurity:   . Worried About Charity fundraiser in the Last Year:   . Arboriculturist in the Last Year:   Transportation Needs:   . Film/video editor (Medical):   Marland Kitchen Lack of Transportation (Non-Medical):   Physical Activity:   . Days of Exercise per Week:   . Minutes of Exercise per Session:   Stress:   . Feeling of  Stress :   Social Connections:   . Frequency of Communication with Friends and Family:   . Frequency of Social Gatherings with Friends and Family:   . Attends Religious Services:   . Active Member of Clubs or Organizations:   . Attends Archivist Meetings:   Marland Kitchen Marital Status:    Family History  Problem Relation Age of Onset  . Diabetes Mother   . Hypertension Mother   . Hypertension Father   . Diabetes Father   . Breast cancer Neg Hx   . Ovarian cancer Neg Hx   . Colon cancer Neg Hx    No Known Allergies Prior to Admission medications   Medication Sig Start Date End Date Taking? Authorizing Provider  fexofenadine (ALLEGRA) 180 MG tablet Take 180 mg by mouth daily.   Yes [provider]  Multiple Vitamins-Minerals (MULTIVITAMIN ADULT EXTRA C PO) Take by mouth.   Yes [provider]  Probiotic Product (PROBIOTIC-10 PO) Take by mouth.   Yes [provider]  triamcinolone (NASACORT ALLERGY 24HR) 55 MCG/ACT AERO nasal inhaler Place 2 sprays into the nose daily.   Yes [provider]     Positive ROS: Otherwise negative  All other  systems have been reviewed and were otherwise negative with the exception of those mentioned in the HPI and as above.  Physical Exam: Constitutional: Alert, well-appearing, no acute distress Ears: External ears without lesions or tenderness.  On microscopic examination of the ears both TMs appear clear with no obvious middle ear effusion noted.  However on tuning fork testing she heard better in the left ear compared to the right.  AC was greater than BC bilaterally however Weber lateralized to the right. Nasal: External nose without lesions. Septum midline with mild rhinitis.  Both middle meatus regions were clear with no signs of infection..  Oral: Lips and gums without lesions. Tongue and palate mucosa without lesions. Posterior oropharynx clear. Neck: No palpable adenopathy or masses Respiratory: Breathing  comfortably  Skin: No facial/neck lesions or rash noted.  Procedures  Assessment: Mild right ear hearing loss questionable SNHL versus middle ear effusion although TM is clear on microscopic exam. Clinically no symptoms of acute infection.  Plan: Placed her on prednisone starting with 40 mg x 2 days, 30 mg x 2 days, 20 mg 2 days, 10 mg x 2 days.  She will follow-up in 10 to 14 days for recheck and audiologic test. She will continue with her Flonase and Zyrtec.   Narda Bonds, MD   CC:

## 2020-07-29 NOTE — Progress Notes (Deleted)
PCP:  Patient, No Pcp Per   No chief complaint on file.    HPI:      Tina Simon is a 34 y.o. G0P0000 whose LMP was No LMP recorded., presents today for her annual examination.  Her menses are regular every 28-30 days, lasting 7 days.  Dysmenorrhea {dysmen:716}. She {does:18564} have intermenstrual bleeding.  Sex activity: {sex active:315163}.  Last Pap: 09/29/17  Results were: no abnormalities /neg HPV DNA  Hx of STDs: {STD hx:14358}  There is no FH of breast cancer. There is no FH of ovarian cancer. The patient {does:18564} do self-breast exams.  Tobacco use: {tob:20664} Alcohol use: {Alcohol:11675} No drug use.  Exercise: {exercise:31265}  She {does:18564} get adequate calcium and Vitamin D in her diet.   The pregnancy intention screening data noted above was reviewed. Potential methods of contraception were discussed. The patient elected to proceed with {Upstream End Methods:24109}.     Past Medical History:  Diagnosis Date  . Anemia   . Environmental allergies   . HPV in female     Past Surgical History:  Procedure Laterality Date  . BARTHOLIN CYST MARSUPIALIZATION      Family History  Problem Relation Age of Onset  . Diabetes Mother   . Hypertension Mother   . Hypertension Father   . Diabetes Father   . Breast cancer Neg Hx   . Ovarian cancer Neg Hx   . Colon cancer Neg Hx     Social History   Socioeconomic History  . Marital status: Single    Spouse name: Not on file  . Number of children: Not on file  . Years of education: Not on file  . Highest education level: Not on file  Occupational History  . Not on file  Tobacco Use  . Smoking status: Never Smoker  . Smokeless tobacco: Never Used  Vaping Use  . Vaping Use: Never used  Substance and Sexual Activity  . Alcohol use: No  . Drug use: No  . Sexual activity: Not Currently    Birth control/protection: None  Other Topics Concern  . Not on file  Social History Narrative  . Not  on file   Social Determinants of Health   Financial Resource Strain:   . Difficulty of Paying Living Expenses: Not on file  Food Insecurity:   . Worried About Programme researcher, broadcasting/film/video in the Last Year: Not on file  . Ran Out of Food in the Last Year: Not on file  Transportation Needs:   . Lack of Transportation (Medical): Not on file  . Lack of Transportation (Non-Medical): Not on file  Physical Activity:   . Days of Exercise per Week: Not on file  . Minutes of Exercise per Session: Not on file  Stress:   . Feeling of Stress : Not on file  Social Connections:   . Frequency of Communication with Friends and Family: Not on file  . Frequency of Social Gatherings with Friends and Family: Not on file  . Attends Religious Services: Not on file  . Active Member of Clubs or Organizations: Not on file  . Attends Banker Meetings: Not on file  . Marital Status: Not on file  Intimate Partner Violence:   . Fear of Current or Ex-Partner: Not on file  . Emotionally Abused: Not on file  . Physically Abused: Not on file  . Sexually Abused: Not on file     Current Outpatient Medications:  .  fexofenadine (  ALLEGRA) 180 MG tablet, Take 180 mg by mouth daily., Disp: , Rfl:  .  Multiple Vitamins-Minerals (MULTIVITAMIN ADULT EXTRA C PO), Take by mouth., Disp: , Rfl:  .  Probiotic Product (PROBIOTIC-10 PO), Take by mouth., Disp: , Rfl:  .  triamcinolone (NASACORT ALLERGY 24HR) 55 MCG/ACT AERO nasal inhaler, Place 2 sprays into the nose daily., Disp: , Rfl:      ROS:  Review of Systems BREAST: No symptoms   Objective: There were no vitals taken for this visit.   OBGyn Exam  Results: No results found for this or any previous visit (from the past 24 hour(s)).  Assessment/Plan: No diagnosis found.  No orders of the defined types were placed in this encounter.            GYN counsel {counseling:16159}     F/U  No follow-ups on file.  Tina Cumpton B. Ellason Segar,  PA-C 07/29/2020 4:14 PM

## 2020-07-30 ENCOUNTER — Ambulatory Visit: Payer: PRIVATE HEALTH INSURANCE | Admitting: Obstetrics and Gynecology

## 2020-08-18 ENCOUNTER — Other Ambulatory Visit (INDEPENDENT_AMBULATORY_CARE_PROVIDER_SITE_OTHER): Payer: Self-pay | Admitting: Otolaryngology

## 2020-09-01 ENCOUNTER — Encounter (INDEPENDENT_AMBULATORY_CARE_PROVIDER_SITE_OTHER): Payer: Self-pay

## 2020-09-18 ENCOUNTER — Encounter: Payer: Self-pay | Admitting: Oncology

## 2020-09-18 ENCOUNTER — Inpatient Hospital Stay: Payer: PRIVATE HEALTH INSURANCE | Attending: Oncology | Admitting: Oncology

## 2020-09-18 ENCOUNTER — Inpatient Hospital Stay: Payer: PRIVATE HEALTH INSURANCE

## 2020-09-18 VITALS — BP 164/96 | HR 116 | Temp 98.2°F | Resp 20 | Wt 191.5 lb

## 2020-09-18 DIAGNOSIS — N920 Excessive and frequent menstruation with regular cycle: Secondary | ICD-10-CM | POA: Diagnosis not present

## 2020-09-18 DIAGNOSIS — Z8249 Family history of ischemic heart disease and other diseases of the circulatory system: Secondary | ICD-10-CM | POA: Insufficient documentation

## 2020-09-18 DIAGNOSIS — N750 Cyst of Bartholin's gland: Secondary | ICD-10-CM | POA: Insufficient documentation

## 2020-09-18 DIAGNOSIS — R9431 Abnormal electrocardiogram [ECG] [EKG]: Secondary | ICD-10-CM | POA: Insufficient documentation

## 2020-09-18 DIAGNOSIS — D500 Iron deficiency anemia secondary to blood loss (chronic): Secondary | ICD-10-CM

## 2020-09-18 DIAGNOSIS — R030 Elevated blood-pressure reading, without diagnosis of hypertension: Secondary | ICD-10-CM | POA: Insufficient documentation

## 2020-09-18 DIAGNOSIS — Z7984 Long term (current) use of oral hypoglycemic drugs: Secondary | ICD-10-CM

## 2020-09-18 DIAGNOSIS — D509 Iron deficiency anemia, unspecified: Secondary | ICD-10-CM | POA: Insufficient documentation

## 2020-09-18 NOTE — Progress Notes (Signed)
Hematology/Oncology Consult note [facility] [phone] [phone]   Patient Care Team: Patient, No Pcp Per as PCP - General (General Practice)  REFERRING PROVIDER: Cyndia Diver, PA-C CHIEF COMPLAINTS/REASON FOR VISIT:  Evaluation of iron deficiency anemia  HISTORY OF PRESENTING ILLNESS:  Tina Simon is a  34 y.o.  female with PMH listed below was seen in consultation at the request of Cyndia Diver, PA-C   for evaluation of iron deficiency anemia.   Reviewed patient's recent labs  09/10/20 Labs revealed anemia with hemoglobin of 10.2 MCV 73.9,    Reviewed patient's previous labs ordered by primary care physician's office, anemia is chronic onset , duration is since 2013.  No aggravating or improving factors.  Associated signs and symptoms: Patient reports fatigue.  Mild SOB with exertion.  Denies weight loss, easy bruising, hematochezia, hemoptysis, hematuria. Context:  History of iron deficiency: long standing IDA, she takes oral iron supplementation once daily which makes her very constipated.  Rectal bleeding: denies Menstrual bleeding/ Vaginal bleeding : chronic heavy menses, previously tried OCP, cannot tolerate.  Hematemesis or hemoptysis : denies Blood in urine : denies   Denies any other bleeding problems. No post procedure bleeding complication after previous wisdom teeth extraction. No easy bruising.   Review of Systems  Constitutional: Positive for fatigue. Negative for appetite change, chills and fever.  HENT:   Negative for hearing loss and voice change.   Eyes: Negative for eye problems.  Respiratory: Negative for chest tightness and cough.   Cardiovascular: Negative for chest pain.  Gastrointestinal: Negative for abdominal distention, abdominal pain and blood in stool.  Endocrine: Negative for hot flashes.  Genitourinary: Negative for difficulty urinating and frequency.   Musculoskeletal: Negative for  arthralgias.  Skin: Negative for itching and rash.  Neurological: Negative for extremity weakness.  Hematological: Negative for adenopathy.  Psychiatric/Behavioral: Negative for confusion.    MEDICAL HISTORY:  Past Medical History:  Diagnosis Date  . Anemia   . Environmental allergies   . HPV in female     SURGICAL HISTORY: Past Surgical History:  Procedure Laterality Date  . BARTHOLIN CYST MARSUPIALIZATION      SOCIAL HISTORY: Social History   Socioeconomic History  . Marital status: Single    Spouse name: Not on file  . Number of children: Not on file  . Years of education: Not on file  . Highest education level: Not on file  Occupational History  . Not on file  Tobacco Use  . Smoking status: Never Smoker  . Smokeless tobacco: Never Used  Vaping Use  . Vaping Use: Never used  Substance and Sexual Activity  . Alcohol use: No  . Drug use: No  . Sexual activity: Not Currently    Birth control/protection: None  Other Topics Concern  . Not on file  Social History Narrative  . Not on file   Social Determinants of Health   Financial Resource Strain:   . Difficulty of Paying Living Expenses: Not on file  Food Insecurity:   . Worried About Programme researcher, broadcasting/film/video in the Last Year: Not on file  . Ran Out of Food in the Last Year: Not on file  Transportation Needs:   . Lack of Transportation (Medical): Not on file  . Lack of Transportation (Non-Medical): Not on file  Physical Activity:   . Days of Exercise per Week: Not on file  . Minutes of Exercise per Session: Not on file  Stress:   . Feeling  of Stress : Not on file  Social Connections:   . Frequency of Communication with Friends and Family: Not on file  . Frequency of Social Gatherings with Friends and Family: Not on file  . Attends Religious Services: Not on file  . Active Member of Clubs or Organizations: Not on file  . Attends Banker Meetings: Not on file  . Marital Status: Not on file    Intimate Partner Violence:   . Fear of Current or Ex-Partner: Not on file  . Emotionally Abused: Not on file  . Physically Abused: Not on file  . Sexually Abused: Not on file    FAMILY HISTORY: Family History  Problem Relation Age of Onset  . Diabetes Mother   . Hypertension Mother   . Hypertension Father   . Diabetes Father   . Breast cancer Neg Hx   . Ovarian cancer Neg Hx   . Colon cancer Neg Hx     ALLERGIES:  has No Known Allergies.  MEDICATIONS:  Current Outpatient Medications  Medication Sig Dispense Refill  . cetirizine (ZYRTEC) 10 MG tablet Take 10 mg by mouth daily.    . Cholecalciferol 25 MCG (1000 UT) tablet Take by mouth.    . Cyanocobalamin 1000 MCG LOZG Place under the tongue.    . ferrous sulfate 325 (65 FE) MG tablet Take 325 mg by mouth daily.    . fexofenadine (ALLEGRA) 180 MG tablet Take 180 mg by mouth daily.    . metFORMIN (GLUCOPHAGE-XR) 500 MG 24 hr tablet Take by mouth.    . Multiple Vitamins-Minerals (MULTIVITAMIN ADULT EXTRA C PO) Take by mouth.    . Probiotic Product (PROBIOTIC-10 PO) Take by mouth.    . triamcinolone (NASACORT ALLERGY 24HR) 55 MCG/ACT AERO nasal inhaler Place 2 sprays into the nose daily.     No current facility-administered medications for this visit.     PHYSICAL EXAMINATION: ECOG PERFORMANCE STATUS: 0 - Asymptomatic Vitals:   09/18/20 0942  BP: (!) 164/96  Pulse: (!) 116  Resp: 20  Temp: 98.2 F (36.8 C)  SpO2: 100%   Filed Weights   09/18/20 0942  Weight: 191 lb 8 oz (86.9 kg)    Physical Exam Constitutional:      General: She is not in acute distress. HENT:     Head: Normocephalic and atraumatic.  Eyes:     General: No scleral icterus. Cardiovascular:     Rate and Rhythm: Normal rate and regular rhythm.     Heart sounds: Normal heart sounds.  Pulmonary:     Effort: Pulmonary effort is normal. No respiratory distress.     Breath sounds: No wheezing.  Abdominal:     General: Bowel sounds are normal.  There is no distension.     Palpations: Abdomen is soft.  Musculoskeletal:        General: No deformity. Normal range of motion.     Cervical back: Normal range of motion and neck supple.  Skin:    General: Skin is warm and dry.     Findings: No erythema or rash.  Neurological:     Mental Status: She is alert and oriented to person, place, and time. Mental status is at baseline.     Cranial Nerves: No cranial nerve deficit.     Coordination: Coordination normal.  Psychiatric:        Mood and Affect: Mood normal.       CMP Latest Ref Rng & Units 10/02/2018  Glucose 65 -  99 mg/dL 10(G)  BUN 6 - 20 mg/dL 6  Creatinine 2.69 - 4.85 mg/dL 4.62  Sodium 703 - 500 mmol/L 140  Potassium 3.5 - 5.2 mmol/L 5.2  Chloride 96 - 106 mmol/L 101  CO2 20 - 29 mmol/L 19(L)  Calcium 8.7 - 10.2 mg/dL 9.0  Total Protein 6.0 - 8.5 g/dL 7.0  Total Bilirubin 0.0 - 1.2 mg/dL <9.3  Alkaline Phos 39 - 117 IU/L 95  AST 0 - 40 IU/L 19  ALT 0 - 32 IU/L 9   CBC Latest Ref Rng & Units 10/02/2018  WBC 3.4 - 10.8 x10E3/uL 6.0  Hemoglobin 11.1 - 15.9 g/dL 6.9(LL)  Hematocrit 34.0 - 46.6 % 25.6(L)  Platelets 150 - 450 x10E3/uL 389     LABORATORY DATA:  I have reviewed the data as listed Lab Results  Component Value Date   WBC 6.0 10/02/2018   HGB 6.9 (LL) 10/02/2018   HCT 25.6 (L) 10/02/2018   MCV 61 (L) 10/02/2018   PLT 389 10/02/2018   No results for input(s): NA, K, CL, CO2, GLUCOSE, BUN, CREATININE, CALCIUM, GFRNONAA, GFRAA, PROT, ALBUMIN, AST, ALT, ALKPHOS, BILITOT, BILIDIR, IBILI in the last 8760 hours. Iron/TIBC/Ferritin/ %Sat No results found for: IRON, TIBC, FERRITIN, IRONPCTSAT   RADIOGRAPHIC STUDIES: I have personally reviewed the radiological images as listed and agreed with the findings in the report. No results found.     ASSESSMENT & PLAN:  1. Iron deficiency anemia, unspecified iron deficiency anemia type   2. Menorrhagia with regular cycle    Labs are reviewed and discussed  with patient. Consistent with iron deficiency anemia. Plan IV iron with Venofer 200mg  weekly x 4 doses. Allergy reactions/infusion reaction including anaphylactic reaction discussed with patient. Other side effects include but not limited to high blood pressure, skin rash, weight gain, leg swelling, etc. patient understands that IV Venofer treatment is not safe in first trimester pregnancy.  Patient denies any possibility of being pregnant at this point.  We will check urine hCG prior to IV iron treatments.  Patient voices understanding and willing to proceed.  Menorrhagia, recommend patient to continue follow-up with gynecology for further management.  Orders Placed This Encounter  Procedures  . Pregnancy, urine    Standing Status:   Standing    Number of Occurrences:   4    Standing Expiration Date:   09/18/2021    All questions were answered. The patient knows to call the clinic with any problems questions or concerns.  Cc 09/20/2021, PA-C  Return of visit: 8 weeks Thank you for this kind referral and the opportunity to participate in the care of this patient. A copy of today's note is routed to referring provider   Cyndia Diver, MD, PhD Hematology Oncology [facility] 09/18/2020

## 2020-09-22 ENCOUNTER — Other Ambulatory Visit: Payer: Self-pay | Admitting: Oncology

## 2020-09-22 ENCOUNTER — Encounter: Payer: Self-pay | Admitting: Oncology

## 2020-09-22 DIAGNOSIS — D509 Iron deficiency anemia, unspecified: Secondary | ICD-10-CM

## 2020-09-22 DIAGNOSIS — D500 Iron deficiency anemia secondary to blood loss (chronic): Secondary | ICD-10-CM

## 2020-09-22 HISTORY — DX: Iron deficiency anemia, unspecified: D50.9

## 2020-09-30 ENCOUNTER — Inpatient Hospital Stay: Payer: PRIVATE HEALTH INSURANCE

## 2020-09-30 ENCOUNTER — Other Ambulatory Visit: Payer: Self-pay

## 2020-09-30 ENCOUNTER — Telehealth: Payer: Self-pay

## 2020-09-30 VITALS — BP 134/80 | HR 86

## 2020-09-30 DIAGNOSIS — D500 Iron deficiency anemia secondary to blood loss (chronic): Secondary | ICD-10-CM

## 2020-09-30 DIAGNOSIS — D509 Iron deficiency anemia, unspecified: Secondary | ICD-10-CM | POA: Diagnosis not present

## 2020-09-30 MED ORDER — IRON SUCROSE 20 MG/ML IV SOLN
200.0000 mg | Freq: Once | INTRAVENOUS | Status: AC
  Administered 2020-09-30: 200 mg via INTRAVENOUS
  Filled 2020-09-30: qty 10

## 2020-09-30 MED ORDER — SODIUM CHLORIDE 0.9 % IV SOLN: 200.0000 mg | Freq: Once | INTRAVENOUS | Status: DC

## 2020-09-30 MED ORDER — SODIUM CHLORIDE 0.9 % IV SOLN
Freq: Once | INTRAVENOUS | Status: AC
  Filled 2020-09-30: qty 250

## 2020-10-07 ENCOUNTER — Other Ambulatory Visit: Payer: Self-pay

## 2020-10-07 ENCOUNTER — Telehealth: Payer: Self-pay

## 2020-10-07 ENCOUNTER — Inpatient Hospital Stay: Payer: PRIVATE HEALTH INSURANCE | Attending: Oncology

## 2020-10-07 ENCOUNTER — Inpatient Hospital Stay: Payer: PRIVATE HEALTH INSURANCE

## 2020-10-07 VITALS — BP 113/87 | HR 87 | Temp 97.0°F | Resp 18

## 2020-10-07 DIAGNOSIS — D500 Iron deficiency anemia secondary to blood loss (chronic): Secondary | ICD-10-CM | POA: Diagnosis present

## 2020-10-07 DIAGNOSIS — N920 Excessive and frequent menstruation with regular cycle: Secondary | ICD-10-CM | POA: Diagnosis not present

## 2020-10-07 LAB — PREGNANCY, URINE: Preg Test, Ur: NEGATIVE

## 2020-10-07 MED ORDER — IRON SUCROSE 20 MG/ML IV SOLN
200.0000 mg | Freq: Once | INTRAVENOUS | Status: AC
  Administered 2020-10-07: 200 mg via INTRAVENOUS
  Filled 2020-10-07: qty 10

## 2020-10-07 MED ORDER — SODIUM CHLORIDE 0.9 % IV SOLN: 200.0000 mg | Freq: Once | INTRAVENOUS | Status: DC

## 2020-10-07 MED ORDER — SODIUM CHLORIDE 0.9 % IV SOLN
Freq: Once | INTRAVENOUS | Status: AC
  Filled 2020-10-07: qty 250

## 2020-10-07 NOTE — Telephone Encounter (Signed)
Patient in clinic today to receive IV venofer. Per Infusion RN Amy Jeannetta Nap, pt does not want to get pregnancy tested anymore prior to treatments stating she knows that she is not pregnant. Risks of pregnancy and venofer during first trimester of  pregnancy were discussed at last office visit by MD. Will cancel urine pregnancy test orders.

## 2020-10-07 NOTE — Telephone Encounter (Signed)
Done..  12/14 and 12/20 Lab appt cx per pt request 11/18/20 appt for labs to be sched 1-2 days prior was corrected scheduler was made aware. I was unable to reach pt by phone a NEW updated reminder letter  will be mailed out.

## 2020-10-07 NOTE — Telephone Encounter (Signed)
Please inform pt of lab date change in Jan

## 2020-10-07 NOTE — Progress Notes (Signed)
Pt tolerated infusion well. No s/s of distress or reaction needed. Pt and VS stable at discharge.   Pt requests that future lab appts be cancelled, pt states that she knows she is not pregnant and that she is sure that she will not become pregnant. Per MD note, risks of receiving Venofer during 1st trimester were discussed and pt stated at that time that there is no way she could/would be pregnant.  Per Dr. Cathie Hoops okay to cancel labs and proceed with Venofer on 10/14/20 and 10/20/20 per patient request as long as she is positive she is not pregnant.

## 2020-10-14 ENCOUNTER — Inpatient Hospital Stay: Payer: PRIVATE HEALTH INSURANCE

## 2020-10-14 VITALS — BP 121/83 | HR 76 | Temp 99.3°F | Resp 16

## 2020-10-14 DIAGNOSIS — D500 Iron deficiency anemia secondary to blood loss (chronic): Secondary | ICD-10-CM | POA: Diagnosis not present

## 2020-10-14 MED ORDER — SODIUM CHLORIDE 0.9 % IV SOLN
Freq: Once | INTRAVENOUS | Status: AC
  Filled 2020-10-14: qty 250

## 2020-10-14 MED ORDER — IRON SUCROSE 20 MG/ML IV SOLN
200.0000 mg | Freq: Once | INTRAVENOUS | Status: AC
  Administered 2020-10-14: 200 mg via INTRAVENOUS
  Filled 2020-10-14: qty 10

## 2020-10-14 MED ORDER — SODIUM CHLORIDE 0.9 % IV SOLN: 200.0000 mg | Freq: Once | INTRAVENOUS | Status: DC

## 2020-10-14 NOTE — Progress Notes (Signed)
Patient tolerated infusion well. Patient and VSS. Discharged home  

## 2020-10-20 ENCOUNTER — Inpatient Hospital Stay: Payer: PRIVATE HEALTH INSURANCE

## 2020-10-27 ENCOUNTER — Inpatient Hospital Stay: Payer: PRIVATE HEALTH INSURANCE

## 2020-10-27 ENCOUNTER — Other Ambulatory Visit: Payer: Self-pay

## 2020-10-27 VITALS — BP 137/81 | HR 93 | Temp 97.0°F | Resp 18

## 2020-10-27 DIAGNOSIS — D500 Iron deficiency anemia secondary to blood loss (chronic): Secondary | ICD-10-CM

## 2020-10-27 MED ORDER — IRON SUCROSE 20 MG/ML IV SOLN
200.0000 mg | Freq: Once | INTRAVENOUS | Status: AC
  Administered 2020-10-27: 200 mg via INTRAVENOUS
  Filled 2020-10-27: qty 10

## 2020-10-27 MED ORDER — SODIUM CHLORIDE 0.9 % IV SOLN
Freq: Once | INTRAVENOUS | Status: AC
  Filled 2020-10-27: qty 250

## 2020-10-27 MED ORDER — SODIUM CHLORIDE 0.9 % IV SOLN: 200.0000 mg | Freq: Once | INTRAVENOUS | Status: DC

## 2020-10-27 NOTE — Progress Notes (Signed)
Pt tolerated infusion well with no signs of complications. VSS. Pt stable for discharge.   Tina Simon  

## 2020-11-17 ENCOUNTER — Other Ambulatory Visit: Payer: PRIVATE HEALTH INSURANCE

## 2020-11-18 ENCOUNTER — Other Ambulatory Visit: Payer: PRIVATE HEALTH INSURANCE

## 2020-11-18 ENCOUNTER — Ambulatory Visit: Payer: PRIVATE HEALTH INSURANCE

## 2020-11-18 ENCOUNTER — Ambulatory Visit: Payer: PRIVATE HEALTH INSURANCE | Admitting: Oncology

## 2020-12-02 ENCOUNTER — Other Ambulatory Visit: Payer: PRIVATE HEALTH INSURANCE

## 2020-12-04 ENCOUNTER — Telehealth: Payer: Self-pay

## 2020-12-04 ENCOUNTER — Inpatient Hospital Stay: Payer: PRIVATE HEALTH INSURANCE | Attending: Oncology

## 2020-12-04 NOTE — Telephone Encounter (Signed)
Patient is scheduled for MD/poss Venofer tomorrow but did not come for the lab appt today.  Scheduling pool message sent to r/s appts.

## 2020-12-05 ENCOUNTER — Inpatient Hospital Stay: Payer: PRIVATE HEALTH INSURANCE | Admitting: Oncology

## 2020-12-05 ENCOUNTER — Inpatient Hospital Stay: Payer: PRIVATE HEALTH INSURANCE

## 2020-12-18 ENCOUNTER — Other Ambulatory Visit: Payer: Self-pay

## 2020-12-18 ENCOUNTER — Ambulatory Visit
Admission: EM | Admit: 2020-12-18 | Discharge: 2020-12-18 | Disposition: A | Discharge status: Home / Self Care | Payer: PRIVATE HEALTH INSURANCE | Attending: Emergency Medicine | Admitting: Emergency Medicine

## 2020-12-18 DIAGNOSIS — J0100 Acute maxillary sinusitis, unspecified: Secondary | ICD-10-CM

## 2020-12-18 MED ORDER — AMOXICILLIN-POT CLAVULANATE 875-125 MG PO TABS: 1.0000 | ORAL_TABLET | Freq: Two times a day (BID) | ORAL | 0 refills | Status: DC

## 2020-12-18 NOTE — ED Provider Notes (Signed)
[facility]    CSN: [id number] Arrival date & time: 12/18/20  0830      History   Chief Complaint Chief Complaint  Patient presents with  . Nasal Congestion  . Allergic Reaction    HPI Tina Simon is a 35 y.o. female.   HPI   35 year old female here for evaluation of sinus pain x1 week.  Patient reports that she is had yellow nasal discharge that has recently turned to green.  She is also had a cough that is productive for a green sputum mostly at night and she developed some diarrhea yesterday.  Patient was also yesterday she was eating a grapefruit and had some pain in the back of her throat and she says she feels like she has something stuck back there.  Patient has had a sore throat for the last week as well.  Patient denies fever, shortness breath or wheezing, nausea, or vomiting.  Past Medical History:  Diagnosis Date  . Anemia   . Environmental allergies   . HPV in female   . IDA (iron deficiency anemia) 09/22/2020    Patient Active Problem List   Diagnosis Date Noted  . IDA (iron deficiency anemia) 09/22/2020  . Abnormal EKG 09/18/2020  . Bartholin cyst 09/18/2020  . Elevated blood pressure reading without diagnosis of hypertension 09/18/2020  . Family history of heart murmur 09/18/2020  . History of abnormal cervical Pap smear 09/29/2017  . Heart murmur 04/14/2012    Past Surgical History:  Procedure Laterality Date  . BARTHOLIN CYST MARSUPIALIZATION      OB History    Gravida  0   Para  0   Term  0   Preterm  0   AB  0   Living  0     SAB  0   IAB  0   Ectopic  0   Multiple  0   Live Births  0            Home Medications    Prior to Admission medications   Medication Sig Start Date End Date Taking? Authorizing Provider  amoxicillin-clavulanate (AUGMENTIN) 875-125 MG tablet Take 1 tablet by mouth every 12 (twelve) hours for 10 days. 12/18/20 12/28/20 Yes Margarette Canada, NP  cetirizine (ZYRTEC) 10 MG tablet Take  10 mg by mouth daily. 08/13/20  Yes [provider]  Cholecalciferol 25 MCG (1000 UT) tablet Take by mouth.   Yes [provider]  Cyanocobalamin 1000 MCG LOZG Place under the tongue.   Yes [provider]  fexofenadine (ALLEGRA) 180 MG tablet Take 180 mg by mouth daily.   Yes [provider]  metFORMIN (GLUCOPHAGE-XR) 500 MG 24 hr tablet Take by mouth. 06/11/20 06/11/21 Yes [provider]  Multiple Vitamins-Minerals (MULTIVITAMIN ADULT EXTRA C PO) Take by mouth.   Yes [provider]  Probiotic Product (PROBIOTIC-10 PO) Take by mouth.   Yes [provider]  ferrous sulfate 325 (65 FE) MG tablet Take 325 mg by mouth daily. 08/18/20 12/18/20  [provider]  triamcinolone (NASACORT) 55 MCG/ACT AERO nasal inhaler Place 2 sprays into the nose daily.  12/18/20  [provider]    Family History Family History  Problem Relation Age of Onset  . Diabetes Mother   . Hypertension Mother   . Hypertension Father   . Diabetes Father   . Breast cancer Neg Hx   . Ovarian cancer Neg Hx   . Colon cancer Neg Hx  Social History Social History   Tobacco Use  . Smoking status: Never Smoker  . Smokeless tobacco: Never Used  Vaping Use  . Vaping Use: Never used  Substance Use Topics  . Alcohol use: No  . Drug use: No     Allergies   Patient has no known allergies.   Review of Systems Review of Systems  Constitutional: Negative for activity change, appetite change and fever.  HENT: Positive for congestion, sinus pressure and sore throat. Negative for ear pain.   Respiratory: Positive for cough. Negative for shortness of breath and wheezing.   Gastrointestinal: Positive for diarrhea. Negative for nausea and vomiting.  Skin: Negative for rash.  Hematological: Negative.   Psychiatric/Behavioral: Negative.      Physical Exam Triage Vital Signs ED Triage Vitals  Enc Vitals Group     BP 12/18/20 0930 (!)  156/103     Pulse Rate 12/18/20 0930 (!) 110     Resp 12/18/20 0930 18     Temp 12/18/20 0930 99 F (37.2 C)     Temp Source 12/18/20 0930 Oral     SpO2 12/18/20 0930 100 %     Weight 12/18/20 0927 165 lb (74.8 kg)     Height 12/18/20 0927 '5\' 3"'  (1.6 m)     Head Circumference --      Peak Flow --      Pain Score 12/18/20 0927 6     Pain Loc --      Pain Edu? --      Excl. in [place]? --    No data found.  Updated Vital Signs BP (!) 156/103 (BP Location: Left Arm)   Pulse (!) 110   Temp 99 F (37.2 C) (Oral)   Resp 18   Ht '5\' 3"'  (1.6 m)   Wt 165 lb (74.8 kg)   LMP 12/08/2020   SpO2 100%   BMI 29.23 kg/m   Visual Acuity Right Eye Distance:   Left Eye Distance:   Bilateral Distance:    Right Eye Near:   Left Eye Near:    Bilateral Near:     Physical Exam Vitals and nursing note reviewed.  Constitutional:      General: She is not in acute distress.    Appearance: Normal appearance. She is not ill-appearing.  HENT:     Head: Normocephalic and atraumatic.     Right Ear: Tympanic membrane, ear canal and external ear normal.     Left Ear: Tympanic membrane, ear canal and external ear normal.     Nose: Congestion and rhinorrhea present.     Comments: Nasal mucosa is erythematous edematous with purulent discharge in both nares.    Mouth/Throat:     Mouth: Mucous membranes are moist.     Pharynx: Oropharynx is clear. Posterior oropharyngeal erythema present.     Comments: Also pillars are retracted and unremarkable.  Posterior oropharynx has mild erythema and a green postnasal drip. Neck:     Comments: Patient has a single, tender, freely mobile lymph node present on the superior aspect of the left neck.  This is the same spot where patient says she feels a foreign body sensation and pain when she swallows. Cardiovascular:     Rate and Rhythm: Normal rate and regular rhythm.     Pulses: Normal pulses.     Heart sounds: Normal heart sounds. No murmur heard.   Pulmonary:      Effort: Pulmonary effort is normal.     Breath  sounds: Normal breath sounds. No wheezing, rhonchi or rales.  Musculoskeletal:     Cervical back: Normal range of motion and neck supple.  Lymphadenopathy:     Cervical: Cervical adenopathy present.  Skin:    General: Skin is warm and dry.     Capillary Refill: Capillary refill takes less than 2 seconds.     Findings: No erythema or rash.  Neurological:     General: No focal deficit present.     Mental Status: She is alert and oriented to person, place, and time.  Psychiatric:        Mood and Affect: Mood normal.        Behavior: Behavior normal.        Thought Content: Thought content normal.        Judgment: Judgment normal.      UC Treatments / Results  Labs (all labs ordered are listed, but only abnormal results are displayed) Labs Reviewed - No data to display  EKG   Radiology No results found.  Procedures Procedures (including critical care time)  Medications Ordered in UC Medications - No data to display  Initial Impression / Assessment and Plan / UC Course  I have reviewed the triage vital signs and the nursing notes.  Pertinent labs & imaging results that were available during my care of the patient were reviewed by me and considered in my medical decision making (see chart for details).   Is a very pleasant 35 year old female here for evaluation of sinus symptoms that been going on for the past week.  She is also complaining of pain when she swallows on the left side of her neck.  Physical exam reveals congestion of the nasal tissues with purulent discharge and tenderness to percussion of her maxillary sinuses.  Patient does have a single swollen, tender, freely mobile lymph node in the upper left upper anterior neck.  Lungs are clear to auscultation all fields.  Patient symptoms are consistent with maxillary sinusitis.  Will treat patient with Augmentin twice daily for 10 days, sinus irrigation, have her  continue Mucinex, and return for any new or worsening symptoms.  Final Clinical Impressions(s) / UC Diagnoses   Final diagnoses:  Acute non-recurrent maxillary sinusitis     Discharge Instructions     The Augmentin twice daily with food for 10 days for treatment of your sinusitis.  Perform sinus irrigation 2-3 times a day with a NeilMed sinus rinse kit and distilled water.  Do not use tap water.  You can use plain over-the-counter Mucinex every 6 hours to break up the stickiness of the mucus so your body can clear it.  Increase your oral fluid intake to thin out your mucus so that is also able for your body to clear more easily.  Take an over-the-counter probiotic, such as Culturelle-align-activia, 1 hour after each dose of antibiotic to prevent diarrhea.  If you develop any new or worsening symptoms return for reevaluation or see your primary care provider.    ED Prescriptions    Medication Sig Dispense Auth. Provider   amoxicillin-clavulanate (AUGMENTIN) 875-125 MG tablet Take 1 tablet by mouth every 12 (twelve) hours for 10 days. 20 tablet Margarette Canada, NP     PDMP not reviewed this encounter.   Margarette Canada, NP 12/18/20 1004

## 2020-12-18 NOTE — Discharge Instructions (Addendum)
The Augmentin twice daily with food for 10 days for treatment of your sinusitis.  Perform sinus irrigation 2-3 times a day with a NeilMed sinus rinse kit and distilled water.  Do not use tap water.  You can use plain over-the-counter Mucinex every 6 hours to break up the stickiness of the mucus so your body can clear it.  Increase your oral fluid intake to thin out your mucus so that is also able for your body to clear more easily.  Take an over-the-counter probiotic, such as Culturelle-align-activia, 1 hour after each dose of antibiotic to prevent diarrhea.  If you develop any new or worsening symptoms return for reevaluation or see your primary care provider.  

## 2020-12-18 NOTE — ED Triage Notes (Signed)
Patient states that she has been having sinus pain and pressure x 1 week . Reports that last night she ate grapefruit and seemed to really irritate her throat. States that she has felt like there is something stuck in her throat.

## 2020-12-23 ENCOUNTER — Other Ambulatory Visit: Payer: Self-pay

## 2020-12-23 ENCOUNTER — Ambulatory Visit
Admission: EM | Admit: 2020-12-23 | Discharge: 2020-12-23 | Disposition: A | Discharge status: Home / Self Care | Payer: PRIVATE HEALTH INSURANCE | Attending: Emergency Medicine | Admitting: Emergency Medicine

## 2020-12-23 ENCOUNTER — Ambulatory Visit (INDEPENDENT_AMBULATORY_CARE_PROVIDER_SITE_OTHER): Payer: PRIVATE HEALTH INSURANCE

## 2020-12-23 DIAGNOSIS — R1310 Dysphagia, unspecified: Secondary | ICD-10-CM

## 2020-12-23 DIAGNOSIS — J029 Acute pharyngitis, unspecified: Secondary | ICD-10-CM

## 2020-12-23 LAB — GROUP A STREP BY PCR: Group A Strep by PCR: NOT DETECTED

## 2020-12-23 MED ORDER — KETOROLAC TROMETHAMINE 10 MG PO TABS: 10.0000 mg | ORAL_TABLET | Freq: Four times a day (QID) | ORAL | 0 refills | Status: DC | PRN

## 2020-12-23 MED ORDER — IBUPROFEN 600 MG PO TABS: 600.0000 mg | ORAL_TABLET | Freq: Four times a day (QID) | ORAL | 0 refills | Status: DC | PRN

## 2020-12-23 NOTE — ED Provider Notes (Signed)
[facility]    CSN: [id number] Arrival date & time: 12/23/20  1010      History   Chief Complaint Chief Complaint  Patient presents with  . Sore Throat    HPI Tina Simon is a 35 y.o. female.   HPI   35 year old female here for reevaluation of painful swallowing.  Patient reports that she is continuing to have painful swallowing and indicates that the pain has switched from her left side to her right side patient states that drinking hot liquids helps but nothing completely alleviates the pain.  Patient is also had a headache.  Patient denies any fever or trouble breathing.  Past Medical History:  Diagnosis Date  . Anemia   . Environmental allergies   . HPV in female   . IDA (iron deficiency anemia) 09/22/2020    Patient Active Problem List   Diagnosis Date Noted  . IDA (iron deficiency anemia) 09/22/2020  . Abnormal EKG 09/18/2020  . Bartholin cyst 09/18/2020  . Elevated blood pressure reading without diagnosis of hypertension 09/18/2020  . Family history of heart murmur 09/18/2020  . History of abnormal cervical Pap smear 09/29/2017  . Heart murmur 04/14/2012    Past Surgical History:  Procedure Laterality Date  . BARTHOLIN CYST MARSUPIALIZATION      OB History    Gravida  0   Para  0   Term  0   Preterm  0   AB  0   Living  0     SAB  0   IAB  0   Ectopic  0   Multiple  0   Live Births  0            Home Medications    Prior to Admission medications   Medication Sig Start Date End Date Taking? Authorizing Provider  ketorolac (TORADOL) 10 MG tablet Take 1 tablet (10 mg total) by mouth every 6 (six) hours as needed. 12/23/20  Yes Becky Augusta, NP  cetirizine (ZYRTEC) 10 MG tablet Take 10 mg by mouth daily. 08/13/20   [provider]  Cholecalciferol 25 MCG (1000 UT) tablet Take by mouth.    [provider]  Cyanocobalamin 1000 MCG LOZG Place under the tongue.    [provider]   fexofenadine (ALLEGRA) 180 MG tablet Take 180 mg by mouth daily.    [provider]  metFORMIN (GLUCOPHAGE-XR) 500 MG 24 hr tablet Take by mouth. 06/11/20 06/11/21  [provider]  Multiple Vitamins-Minerals (MULTIVITAMIN ADULT EXTRA C PO) Take by mouth.    [provider]  Probiotic Product (PROBIOTIC-10 PO) Take by mouth.    [provider]  ferrous sulfate 325 (65 FE) MG tablet Take 325 mg by mouth daily. 08/18/20 12/18/20  [provider]  triamcinolone (NASACORT) 55 MCG/ACT AERO nasal inhaler Place 2 sprays into the nose daily.  12/18/20  [provider]    Family History Family History  Problem Relation Age of Onset  . Diabetes Mother   . Hypertension Mother   . Hypertension Father   . Diabetes Father   . Breast cancer Neg Hx   . Ovarian cancer Neg Hx   . Colon cancer Neg Hx     Social History Social History   Tobacco Use  . Smoking status: Never Smoker  . Smokeless tobacco: Never Used  Vaping Use  . Vaping Use: Never used  Substance Use Topics  . Alcohol use: No  . Drug use: No  Allergies   Patient has no known allergies.   Review of Systems Review of Systems  Constitutional: Negative for activity change and fever.  HENT: Positive for sore throat.   Respiratory: Negative for cough, choking, shortness of breath, wheezing and stridor.   Gastrointestinal: Negative for nausea and vomiting.  Hematological: Negative for adenopathy.  Psychiatric/Behavioral: Negative.      Physical Exam Triage Vital Signs ED Triage Vitals [12/23/20 1041]  Enc Vitals Group     BP (!) 175/103     Pulse Rate (!) 105     Resp 16     Temp 98.5 F (36.9 C)     Temp Source Oral     SpO2 100 %     Weight 165 lb (74.8 kg)     Height 5\' 3"  (1.6 m)     Head Circumference      Peak Flow      Pain Score 7     Pain Loc      Pain Edu?      Excl. in GC?    No data found.  Updated Vital Signs BP (!) 175/103   Pulse (!) 105    Temp 98.5 F (36.9 C) (Oral)   Resp 16   Ht 5\' 3"  (1.6 m)   Wt 165 lb (74.8 kg)   LMP 12/08/2020   SpO2 100%   BMI 29.23 kg/m   Visual Acuity Right Eye Distance:   Left Eye Distance:   Bilateral Distance:    Right Eye Near:   Left Eye Near:    Bilateral Near:     Physical Exam Vitals and nursing note reviewed.  Constitutional:      General: She is not in acute distress.    Appearance: She is well-developed. She is not ill-appearing.  HENT:     Head: Normocephalic and atraumatic.     Mouth/Throat:     Mouth: Mucous membranes are moist.     Pharynx: Oropharynx is clear. Uvula midline. No oropharyngeal exudate or uvula swelling.     Tonsils: Tonsillar exudate and tonsillar abscess present. 0 on the right. 0 on the left.  Cardiovascular:     Rate and Rhythm: Normal rate and regular rhythm.     Heart sounds: Normal heart sounds. No murmur heard. No gallop.   Pulmonary:     Effort: Pulmonary effort is normal.     Breath sounds: Normal breath sounds. No stridor. No wheezing, rhonchi or rales.  Musculoskeletal:     Cervical back: Normal range of motion and neck supple.  Lymphadenopathy:     Cervical: No cervical adenopathy.  Skin:    General: Skin is warm and dry.     Capillary Refill: Capillary refill takes less than 2 seconds.     Findings: No erythema.  Neurological:     General: No focal deficit present.     Mental Status: She is alert and oriented to person, place, and time.  Psychiatric:        Mood and Affect: Mood normal.        Behavior: Behavior normal.      UC Treatments / Results  Labs (all labs ordered are listed, but only abnormal results are displayed) Labs Reviewed  GROUP A STREP BY PCR    EKG   Radiology DG Neck Soft Tissue  Result Date: 12/23/2020 CLINICAL DATA:  Sore throat.  Painful swallowing. EXAM: NECK SOFT TISSUES - 1+ VIEW COMPARISON:  No prior. FINDINGS: Loss of normal cervical lordosis.  No acute bony abnormality identified.  Adenoids and tonsils appear unremarkable. Epiglottis and retropharyngeal space appears unremarkable. No acute bony abnormality. Subglottic tracheal narrowing noted on AP view only. This is most likely related to phase of respiration/swallowing, clinical correlation suggested. Trachea appears widely patent on lateral view. Pulmonary apices are clear. IMPRESSION: Loss of normal cervical lordosis. No acute bony abnormality identified. No acute soft tissue abnormality identified. If symptoms persists contrast-enhanced neck CT can be obtained. Electronically Signed   By: Maisie Fus  Register   On: 12/23/2020 12:04    Procedures Procedures (including critical care time)  Medications Ordered in UC Medications - No data to display  Initial Impression / Assessment and Plan / UC Course  I have reviewed the triage vital signs and the nursing notes.  Pertinent labs & imaging results that were available during my care of the patient were reviewed by me and considered in my medical decision making (see chart for details).   Patient presents for reevaluation of painful swallowing.  Patient is a very pleasant 35 year old female who was seen in this urgent care on 12/18/2020 diagnosed with maxillary sinusitis.  Patient reported at that time that she had been eating some grapefruit the day before she presented and felt a sharp pain in the back of her throat and feels like something was stuck back there.  At that time her physical exam revealed posterior oropharyngeal erythema with some green postnasal drip and a single tender freely mobile lymph node present on the left side of her neck superiorly adjacent to the trachea.  Physical exam today reveals a pink and moist posterior oropharynx without any erythema or injection.  No postnasal drip noted.  There is no cervical lymphadenopathy appreciated on exam.  Patient has no stridor when auscultating over the trachea on either side.  Patient describes her pain as being just  below the level of her clavicle.  The thyroid does not feel enlarged and there are no palpable nodules.  Patient denies any history of thyroid issues and reports that she gets her levels checked annually.  Her last TSH was checked on 09/10/2020 and it was 0.825.  The time previous her TSH was 1.061 on 06/09/2020.  Will obtain soft tissue neck to look for any inflammation.  If that is negative will refer her back to her PCP for further evaluation and possible thyroid ultrasound.  Strep PCR collected at triage and it was negative.  Interpretation of soft tissue neck images no acute soft tissue abnormality.  Discussed the findings of the x-ray and labs with the patient as well as the possibility of a CT soft tissue neck with contrast possibly yielding more results.  Patient symptoms consist of pain mainly with swallowing but not otherwise.  With the absence of fever, mass-effect or tracheal deviation on physical exam or x-ray, in the absence of constant pain feel this is more soft tissue in nature and less likely to be abscess formation.  Patient is agreeable to treating it conservatively with some Toradol for inflammation continuing to drink fluids, and then returning if any new or worsening symptoms develop.   Final Clinical Impressions(s) / UC Diagnoses   Final diagnoses:  Painful swallowing     Discharge Instructions     Use the Toradol every 6 hours as needed for pain and inflammation.  Take this with food.  Continue to drink warm beverages if this is helping your pain.  Eat smaller bites of food and swallow them with fluid to make  swallowing less painful and easier.  If you develop any increase in your pain, fever, swelling of your neck, or an inability to swallow food or fluids or trouble breathing return for reevaluation or go to the ER.    ED Prescriptions    Medication Sig Dispense Auth. Provider   ketorolac (TORADOL) 10 MG tablet Take 1 tablet (10 mg total) by mouth every 6 (six)  hours as needed. 20 tablet Becky Augusta, NP     PDMP not reviewed this encounter.   Becky Augusta, NP 12/23/20 1218

## 2020-12-23 NOTE — ED Triage Notes (Signed)
Pt reports having a sore throat x1 week. sts she was seen here last week and told her lymph nodes were swollen on L side. Today the pain is more on the R side. Also reports having pain when swallowing.

## 2020-12-23 NOTE — Discharge Instructions (Addendum)
Use the Toradol every 6 hours as needed for pain and inflammation.  Take this with food.  Continue to drink warm beverages if this is helping your pain.  Eat smaller bites of food and swallow them with fluid to make swallowing less painful and easier.  If you develop any increase in your pain, fever, swelling of your neck, or an inability to swallow food or fluids or trouble breathing return for reevaluation or go to the ER.

## 2020-12-25 ENCOUNTER — Encounter: Payer: Self-pay | Admitting: Emergency Medicine

## 2020-12-25 ENCOUNTER — Other Ambulatory Visit: Payer: Self-pay

## 2020-12-25 ENCOUNTER — Emergency Department
Admission: EM | Admit: 2020-12-25 | Discharge: 2020-12-25 | Disposition: A | Discharge status: Home / Self Care | Payer: PRIVATE HEALTH INSURANCE | Attending: Emergency Medicine | Admitting: Emergency Medicine

## 2020-12-25 DIAGNOSIS — K2100 Gastro-esophageal reflux disease with esophagitis, without bleeding: Secondary | ICD-10-CM | POA: Diagnosis not present

## 2020-12-25 DIAGNOSIS — Z7984 Long term (current) use of oral hypoglycemic drugs: Secondary | ICD-10-CM | POA: Diagnosis not present

## 2020-12-25 DIAGNOSIS — R1310 Dysphagia, unspecified: Secondary | ICD-10-CM | POA: Diagnosis present

## 2020-12-25 LAB — COMPREHENSIVE METABOLIC PANEL
ALT: 14 U/L (ref 0–44)
AST: 17 U/L (ref 15–41)
Albumin: 4.6 g/dL (ref 3.5–5.0)
Alkaline Phosphatase: 98 U/L (ref 38–126)
Anion gap: 10 (ref 5–15)
BUN: 8 mg/dL (ref 6–20)
CO2: 23 mmol/L (ref 22–32)
Calcium: 9.8 mg/dL (ref 8.9–10.3)
Chloride: 102 mmol/L (ref 98–111)
Creatinine, Ser: 0.69 mg/dL (ref 0.44–1.00)
GFR, Estimated: >60 mL/min (ref >60)
Glucose, Bld: 106 mg/dL — ABNORMAL HIGH (ref 70–99)
Potassium: 4.2 mmol/L (ref 3.5–5.1)
Sodium: 135 mmol/L (ref 135–145)
Total Bilirubin: 0.7 mg/dL (ref 0.3–1.2)
Total Protein: 8.9 g/dL — ABNORMAL HIGH (ref 6.5–8.1)

## 2020-12-25 LAB — CBC WITH DIFFERENTIAL/PLATELET
Abs Immature Granulocytes: 0.03 10*3/uL (ref 0.00–0.07)
Basophils Absolute: 0 10*3/uL (ref 0.0–0.1)
Basophils Relative: 0 %
Eosinophils Absolute: 0.2 10*3/uL (ref 0.0–0.5)
Eosinophils Relative: 2 %
HCT: 37.5 % (ref 36.0–46.0)
Hemoglobin: 12.1 g/dL (ref 12.0–15.0)
Immature Granulocytes: 0 %
Lymphocytes Relative: 16 %
Lymphs Abs: 1.4 10*3/uL (ref 0.7–4.0)
MCH: 26.5 pg (ref 26.0–34.0)
MCHC: 32.3 g/dL (ref 30.0–36.0)
MCV: 82.1 fL (ref 80.0–100.0)
Monocytes Absolute: 0.3 10*3/uL (ref 0.1–1.0)
Monocytes Relative: 4 %
Neutro Abs: 6.6 10*3/uL (ref 1.7–7.7)
Neutrophils Relative %: 78 %
Platelets: 401 10*3/uL — ABNORMAL HIGH (ref 150–400)
RBC: 4.57 MIL/uL (ref 3.87–5.11)
RDW: 18.4 % — ABNORMAL HIGH (ref 11.5–15.5)
WBC: 8.6 10*3/uL (ref 4.0–10.5)
nRBC: 0 % (ref 0.0–0.2)

## 2020-12-25 LAB — MONONUCLEOSIS SCREEN: Mono Screen: NEGATIVE

## 2020-12-25 LAB — GROUP A STREP BY PCR: Group A Strep by PCR: NOT DETECTED

## 2020-12-25 MED ORDER — LIDOCAINE VISCOUS HCL 2 % MT SOLN
15.0000 mL | Freq: Once | OROMUCOSAL | Status: AC
  Administered 2020-12-25: 15 mL via ORAL
  Filled 2020-12-25: qty 15

## 2020-12-25 MED ORDER — ALUM & MAG HYDROXIDE-SIMETH 200-200-20 MG/5ML PO SUSP
30.0000 mL | Freq: Once | ORAL | Status: AC
  Administered 2020-12-25: 30 mL via ORAL
  Filled 2020-12-25: qty 30

## 2020-12-25 MED ORDER — PANTOPRAZOLE SODIUM 20 MG PO TBEC: 20.0000 mg | DELAYED_RELEASE_TABLET | Freq: Every day | ORAL | 0 refills | Status: DC

## 2020-12-25 NOTE — ED Provider Notes (Signed)
[facility] Emergency Department Provider Note  ___________________________________________   Event Date/Time   First MD Initiated Contact with Patient 12/25/20 [phone]     (approximate)  I have reviewed the triage vital signs and the nursing notes.   HISTORY  Chief Complaint Sore Throat   HPI Tina Simon is a 35 y.o. female presents to the ED with complaint of sore throat and difficulty swallowing.  She states that this been going on for approximately 2 weeks.  She was seen at urgent care and diagnosed with a sinus infection.  Patient states that she was placed on amoxicillin and ibuprofen which did not help.  She denies any fever, chills, nausea or vomiting.  Patient states that she has had a lot of reflux lately.  Urgent care also did a soft tissue neck x-ray which was negative for any acute findings.  Patient states that she was also prescribed Toradol every 6 hours but did not get the prescription.  She rates her pain as 5 out of 10.         Past Medical History:  Diagnosis Date  . Anemia   . Environmental allergies   . HPV in female   . IDA (iron deficiency anemia) 09/22/2020    Patient Active Problem List   Diagnosis Date Noted  . IDA (iron deficiency anemia) 09/22/2020  . Abnormal EKG 09/18/2020  . Bartholin cyst 09/18/2020  . Elevated blood pressure reading without diagnosis of hypertension 09/18/2020  . Family history of heart murmur 09/18/2020  . History of abnormal cervical Pap smear 09/29/2017  . Heart murmur 04/14/2012    Past Surgical History:  Procedure Laterality Date  . BARTHOLIN CYST MARSUPIALIZATION      Prior to Admission medications   Medication Sig Start Date End Date Taking? Authorizing Provider  pantoprazole (PROTONIX) 20 MG tablet Take 1 tablet (20 mg total) by mouth daily. 12/25/20 12/25/21 Yes Nera Haworth L, PA-C  cetirizine (ZYRTEC) 10 MG tablet Take 10 mg by mouth daily. 08/13/20   [provider]   Cholecalciferol 25 MCG (1000 UT) tablet Take by mouth.    [provider]  Cyanocobalamin 1000 MCG LOZG Place under the tongue.    [provider]  fexofenadine (ALLEGRA) 180 MG tablet Take 180 mg by mouth daily.    [provider]  ibuprofen (ADVIL) 600 MG tablet Take 1 tablet (600 mg total) by mouth every 6 (six) hours as needed. 12/23/20   Becky Augusta, NP  metFORMIN (GLUCOPHAGE-XR) 500 MG 24 hr tablet Take by mouth. 06/11/20 06/11/21  [provider]  Multiple Vitamins-Minerals (MULTIVITAMIN ADULT EXTRA C PO) Take by mouth.    [provider]  Probiotic Product (PROBIOTIC-10 PO) Take by mouth.    [provider]  ferrous sulfate 325 (65 FE) MG tablet Take 325 mg by mouth daily. 08/18/20 12/18/20  [provider]  triamcinolone (NASACORT) 55 MCG/ACT AERO nasal inhaler Place 2 sprays into the nose daily.  12/18/20  [provider]    Allergies Patient has no known allergies.  Family History  Problem Relation Age of Onset  . Diabetes Mother   . Hypertension Mother   . Hypertension Father   . Diabetes Father   . Breast cancer Neg Hx   . Ovarian cancer Neg Hx   . Colon cancer Neg Hx     Social History Social History   Tobacco Use  . Smoking status: Never Smoker  . Smokeless tobacco: Never Used  Vaping Use  . Vaping Use: Never used  Substance Use Topics  . Alcohol use: No  . Drug use: No    Review of Systems Constitutional: No fever/chills Eyes: No visual changes. ENT: Positive for sore throat. Cardiovascular: Denies chest pain.   Respiratory: Denies shortness of breath.  Negative for cough. Gastrointestinal: No abdominal pain.  No nausea, no vomiting.  No diarrhea.  Genitourinary: Negative for dysuria. Musculoskeletal: Negative for musculoskeletal pain. Skin: Negative for rash. Neurological: Negative for headaches, focal weakness or  numbness.  ____________________________________________   PHYSICAL EXAM:  VITAL SIGNS: ED Triage Vitals  Enc Vitals Group     BP 12/25/20 0934 139/86     Pulse Rate 12/25/20 0934 99     Resp 12/25/20 0934 16     Temp 12/25/20 0934 98.6 F (37 C)     Temp Source 12/25/20 0934 Oral     SpO2 12/25/20 0934 99 %     Weight 12/25/20 0929 163 lb 2.3 oz (74 kg)     Height 12/25/20 0929 5\' 3"  (1.6 m)     Head Circumference --      Peak Flow --      Pain Score 12/25/20 0928 5     Pain Loc --      Pain Edu? --      Excl. in GC? --     Constitutional: Alert and oriented. Well appearing and in no acute distress. Eyes: Conjunctivae are normal.  Head: Atraumatic. Nose: No congestion/rhinnorhea. Mouth/Throat: Mucous membranes are moist.  Oropharynx non-erythematous.  No exudate and uvula is midline. Neck: No stridor.   Hematological/Lymphatic/Immunilogical: No cervical lymphadenopathy. Cardiovascular: Normal rate, regular rhythm. Grossly normal heart sounds.  Good peripheral circulation. Respiratory: Normal respiratory effort.  No retractions. Lungs CTAB. Gastrointestinal: Soft and nontender. No distention. Musculoskeletal: Moves upper and lower extremities without difficulty.  Normal gait was noted. Neurologic:  Normal speech and language. No gross focal neurologic deficits are appreciated.  Skin:  Skin is warm, dry and intact.  Psychiatric: Mood and affect are normal. Speech and behavior are normal.  ____________________________________________   LABS (all labs ordered are listed, but only abnormal results are displayed)  Labs Reviewed  CBC WITH DIFFERENTIAL/PLATELET - Abnormal; Notable for the following components:      Result Value   RDW 18.4 (*)    Platelets 401 (*)    All other components within normal limits  COMPREHENSIVE METABOLIC PANEL - Abnormal; Notable for the following components:   Glucose, Bld 106 (*)    Total Protein 8.9 (*)    All other components within  normal limits  GROUP A STREP BY PCR  MONONUCLEOSIS SCREEN     PROCEDURES  Procedure(s) performed (including Critical Care):  Procedures   ____________________________________________   INITIAL IMPRESSION / ASSESSMENT AND PLAN / ED COURSE  As part of my medical decision making, I reviewed the following data within the electronic MEDICAL RECORD NUMBER Notes from prior ED visits and [place] Controlled Substance Database  35 year old female presents to the ED with complaint of sore throat and difficulty swallowing.  She was seen in the urgent care approximately 2 weeks ago which time she was treated with antibiotics which did not help.  A soft tissue neck x-ray was negative.  Patient reports that she has had a great deal of reflux prior to her sore throat.  She has not been on any medication for this.  Lab work is essentially negative along with a negative strep and mono test.  Patient was reassured.  Prior to discharge patient was given Maalox and viscous lidocaine.  A prescription for Protonix 20 mg was sent to patient's pharmacy.  She is aware that if this is not helping she will need to see a gastroenterologist for further evaluation and treatment of her reflux.  ____________________________________________   FINAL CLINICAL IMPRESSION(S) / ED DIAGNOSES  Final diagnoses:  Gastroesophageal reflux disease with esophagitis without hemorrhage     ED Discharge Orders         Ordered    pantoprazole (PROTONIX) 20 MG tablet  Daily        12/25/20 1136          *Please note:  Tina Simon was evaluated in Emergency Department on 12/25/2020 for the symptoms described in the history of present illness. She was evaluated in the context of the global COVID-19 pandemic, which necessitated consideration that the patient might be at risk for infection with the SARS-CoV-2 virus that causes COVID-19. Institutional protocols and algorithms that pertain to the evaluation of patients at risk for COVID-19  are in a state of rapid change based on information released by regulatory bodies including the CDC and federal and state organizations. These policies and algorithms were followed during the patient's care in the ED.  Some ED evaluations and interventions may be delayed as a result of limited staffing during and the pandemic.*   Note:  This document was prepared using Dragon voice recognition software and may include unintentional dictation errors.    Tommi Rumps, PA-C 12/25/20 1455    Sharman Cheek, MD 12/25/20 [phone]

## 2020-12-25 NOTE — ED Notes (Signed)
See triage note  Presents with sore throat and diff swallowing  States she has had fever and sore throat for about 2 weeks  Was seen at Urgent Care and dx'd with sinus infection  Placed on Amoxil and IBU  States no fever at presents but feels like she is having a hard time swallowing

## 2020-12-25 NOTE — Discharge Instructions (Signed)
Begin taking Protonix 1 daily and read over the information about foods to avoid with reflux disease to see if this helps with your throat pain and reflux symptoms.  Avoid alcohol, spicy foods and acidic fruit juices such as orange juice until this calms down.  If not improving you will need to make an appointment with the gastroenterologist.  This way they can take a light and look down your throat to see what is causing the problem.  Dr. Allegra Lai is the gastroenterologist on call today and who you would make an appointment with if needed.  Her contact information is listed on your discharge papers.

## 2020-12-25 NOTE — ED Triage Notes (Signed)
C/O sore throat, worse with swallowing.  Seen through urgent care, but symptoms not improving.  AAOx3.  Skin warm and dry. Voice clear and strong.  NAD

## 2020-12-26 ENCOUNTER — Inpatient Hospital Stay: Payer: PRIVATE HEALTH INSURANCE

## 2020-12-29 ENCOUNTER — Ambulatory Visit (INDEPENDENT_AMBULATORY_CARE_PROVIDER_SITE_OTHER): Payer: PRIVATE HEALTH INSURANCE | Admitting: Gastroenterology

## 2020-12-29 ENCOUNTER — Encounter: Payer: Self-pay | Admitting: Gastroenterology

## 2020-12-29 ENCOUNTER — Other Ambulatory Visit: Payer: Self-pay

## 2020-12-29 VITALS — BP 148/89 | HR 114 | Temp 98.5°F | Ht 63.0 in | Wt 191.0 lb

## 2020-12-29 DIAGNOSIS — R1310 Dysphagia, unspecified: Secondary | ICD-10-CM | POA: Diagnosis not present

## 2020-12-29 MED ORDER — PANTOPRAZOLE SODIUM 40 MG PO TBEC: 40.0000 mg | DELAYED_RELEASE_TABLET | Freq: Every day | ORAL | 2 refills | Status: DC

## 2020-12-29 NOTE — Progress Notes (Signed)
Tina Repress, MD [address]  [address]  [place], [place] [address]  Main: [phone]  Fax: [phone]    Gastroenterology Consultation  Referring Provider:     No ref. provider found Primary Care Physician:  Nira Retort Primary Gastroenterologist:  Dr. Arlyss Simon Reason for Consultation:     Dysphagia        HPI:   Tina Simon is a 35 y.o. female referred by Dr. Nira Retort  for consultation & management of dysphagia.  Patient reports that approximately 2 weeks ago she was having severe sinusitis for which she was treated.  Since then, she has been noticing difficulty swallowing and pain in her throat.  After she was treated for sinusitis, throat discomfort has resolved.  However, she continues to have sensation of food stuck in her throat.  She does have postnasal drip as well.  She started on Protonix 20 mg daily which provides some relief.  She would like to discuss about upper endoscopy today.  Patient denies heartburn, regurgitation, epigastric pain, nausea, vomiting, weight loss.  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  She sees Dr. Cathie Hoops, receiving parenteral iron with resolution of anemia Patient does not smoke or drink alcohol  NSAIDs: Ibuprofen as needed  Antiplts/Anticoagulants/Anti thrombotics: None  GI Procedures: None She denies family history of GI malignancy  Past Medical History:  Diagnosis Date  . Anemia   . Environmental allergies   . HPV in female   . IDA (iron deficiency anemia) 09/22/2020    Past Surgical History:  Procedure Laterality Date  . BARTHOLIN CYST MARSUPIALIZATION      Current Outpatient Medications:  .  cetirizine (ZYRTEC) 10 MG tablet, Take 10 mg by mouth daily., Disp: , Rfl:  .  Cholecalciferol 25 MCG (1000 UT) tablet, Take by mouth., Disp: , Rfl:  .  Cyanocobalamin 1000 MCG LOZG, Place under the tongue., Disp: , Rfl:  .  ergocalciferol (VITAMIN D2)  1.25 MG (50000 UT) capsule, Take 1 capsule by mouth once a week., Disp: , Rfl:  .  fluticasone (FLONASE) 50 MCG/ACT nasal spray, SPRAY 2 SPRAYS INTO EACH NOSTRIL EVERY DAY, Disp: , Rfl:  .  ibuprofen (ADVIL) 600 MG tablet, Take 1 tablet (600 mg total) by mouth every 6 (six) hours as needed., Disp: 30 tablet, Rfl: 0 .  metFORMIN (GLUCOPHAGE-XR) 500 MG 24 hr tablet, Take by mouth., Disp: , Rfl:  .  Multiple Vitamins-Minerals (MULTIVITAMIN ADULT EXTRA C PO), Take by mouth., Disp: , Rfl:  .  pantoprazole (PROTONIX) 40 MG tablet, Take 1 tablet (40 mg total) by mouth daily., Disp: 30 tablet, Rfl: 2 .  Probiotic Product (PROBIOTIC-10 PO), Take by mouth., Disp: , Rfl:    Family History  Problem Relation Age of Onset  . Diabetes Mother   . Hypertension Mother   . Hypertension Father   . Diabetes Father   . Breast cancer Neg Hx   . Ovarian cancer Neg Hx   . Colon cancer Neg Hx      Social History   Tobacco Use  . Smoking status: Never Smoker  . Smokeless tobacco: Never Used  Vaping Use  . Vaping Use: Never used  Substance Use Topics  . Alcohol use: No  . Drug use: No    Allergies as of 12/29/2020  . (No Known Allergies)    Review of Systems:    All systems reviewed and negative except where noted in HPI.  Physical Exam:  BP (!) 148/89 (BP Location: Left Arm, Patient Position: Sitting, Cuff Size: Normal)   Pulse (!) 114   Temp 98.5 F (36.9 C) (Oral)   Ht 5\' 3"  (1.6 m)   Wt 191 lb (86.6 kg)   LMP 12/08/2020   BMI 33.83 kg/m  Patient's last menstrual period was 12/08/2020.  General:   Alert,  Well-developed, well-nourished, pleasant and cooperative in NAD Head:  Normocephalic and atraumatic. Eyes:  Sclera clear, no icterus.   Conjunctiva pink. Ears:  Normal auditory acuity. Nose:  No deformity, discharge, or lesions. Mouth:  No deformity or lesions,oropharynx pink & moist. Neck:  Supple; no masses or thyromegaly. Lungs:  Respirations even and unlabored.  Clear  throughout to auscultation.   No wheezes, crackles, or rhonchi. No acute distress. Heart:  Regular rate and rhythm; no murmurs, clicks, rubs, or gallops. Abdomen:  Normal bowel sounds. Soft, non-tender and non-distended without masses, hepatosplenomegaly or hernias noted.  No guarding or rebound tenderness.   Rectal: Not performed Msk:  Symmetrical without gross deformities. Good, equal movement & strength bilaterally. Pulses:  Normal pulses noted. Extremities:  No clubbing or edema.  No cyanosis. Neurologic:  Alert and oriented x3;  grossly normal neurologically. Skin:  Intact without significant lesions or rashes. No jaundice. Psych:  Alert and cooperative. Normal mood and affect.  Imaging Studies: No abdominal imaging  Assessment and Plan:   Tina Simon is a 35 y.o. pleasant female with 2 weeks history of difficulty swallowing, sensation of food stuck in her throat, symptoms have partially relieved on low-dose Protonix  Recommend to increase Protonix to 40 mg daily Recommend EGD with esophageal biopsies and evaluate for any structural lesions   Follow up after above work-up   31, MD

## 2020-12-30 ENCOUNTER — Inpatient Hospital Stay: Payer: PRIVATE HEALTH INSURANCE

## 2020-12-30 ENCOUNTER — Inpatient Hospital Stay: Payer: PRIVATE HEALTH INSURANCE | Admitting: Oncology

## 2021-01-05 ENCOUNTER — Telehealth: Payer: Self-pay

## 2021-01-05 NOTE — Telephone Encounter (Signed)
Pt lvm states that she would like to cancel her EGD scheduled for 01/14/21 with Dr. Allegra Lai due to her condition has improved. She said she does not know when she needs to follow up.  Please advise as to when she needs to follow up.  Thanks,  [place], [place]

## 2021-01-05 NOTE — Telephone Encounter (Signed)
3 month follow-up

## 2021-01-06 NOTE — Telephone Encounter (Signed)
Patient made follow up appointment on 04/08/2021 with Dr. Allegra Lai. Called Endo and canceled the procedure

## 2021-01-08 ENCOUNTER — Other Ambulatory Visit: Payer: Self-pay

## 2021-01-08 ENCOUNTER — Ambulatory Visit (INDEPENDENT_AMBULATORY_CARE_PROVIDER_SITE_OTHER): Payer: PRIVATE HEALTH INSURANCE | Admitting: Obstetrics and Gynecology

## 2021-01-08 ENCOUNTER — Encounter: Payer: Self-pay | Admitting: Obstetrics and Gynecology

## 2021-01-08 ENCOUNTER — Other Ambulatory Visit (HOSPITAL_COMMUNITY)
Admission: RE | Admit: 2021-01-08 | Discharge: 2021-01-08 | Disposition: A | Discharge status: Home / Self Care | Payer: PRIVATE HEALTH INSURANCE | Source: Ambulatory Visit | Attending: Obstetrics and Gynecology | Admitting: Obstetrics and Gynecology

## 2021-01-08 VITALS — BP 140/90 | Ht 63.0 in | Wt 189.0 lb

## 2021-01-08 DIAGNOSIS — Z1151 Encounter for screening for human papillomavirus (HPV): Secondary | ICD-10-CM | POA: Insufficient documentation

## 2021-01-08 DIAGNOSIS — Z30011 Encounter for initial prescription of contraceptive pills: Secondary | ICD-10-CM | POA: Diagnosis not present

## 2021-01-08 DIAGNOSIS — Z124 Encounter for screening for malignant neoplasm of cervix: Secondary | ICD-10-CM | POA: Insufficient documentation

## 2021-01-08 DIAGNOSIS — Z01419 Encounter for gynecological examination (general) (routine) without abnormal findings: Secondary | ICD-10-CM | POA: Diagnosis not present

## 2021-01-08 DIAGNOSIS — N920 Excessive and frequent menstruation with regular cycle: Secondary | ICD-10-CM

## 2021-01-08 MED ORDER — MICROGESTIN 24 FE 1-20 MG-MCG PO TABS: 1.0000 | ORAL_TABLET | Freq: Every day | ORAL | 3 refills | Status: DC

## 2021-01-08 NOTE — Progress Notes (Signed)
PCP:  Nira Retort   Chief Complaint  Patient presents with  . Pelvic Pain    Left side only usually during cycles, heavy periods still  . Contraception    Interested in OCPs     HPI:      [name] is a 35 y.o. G0P0000 whose LMP was Patient's last menstrual period was 12/30/2020 (exact date)., presents today for her annual examination.  Her menses are regular every 28-30 days, lasting 7 days, 4 heavy days changing products hourly, with clots. Has to miss work monthly due to heavy flow, sometimes with gushing. Hx of IDA and getting iron transfusions. Dysmenorrhea mild, occurring first 1-2 days of flow. She does not have intermenstrual bleeding. Also has LLQ cramping pain mid-cycle around ovulation some months. Would like OCPs for cycle control. Did OTC Lo 8/20 with BTB for 3 months so stopped it. Did depo in distant past and doesn't want it, not interested in IUD. No hx of HTN (BP being watched), DVTs, migraines with aura.  Sex activity: single partner, contraception - condoms Last Pap: 09/29/17  Results were: no abnormalities /neg HPV DNA   There is no FH of breast cancer. There is no FH of ovarian cancer. The patient does not do self-breast exams.  Tobacco use: The patient denies current or previous tobacco use. Alcohol use: none No drug use.  Exercise: moderately active  She does get adequate calcium and Vitamin D in her diet.   Past Medical History:  Diagnosis Date  . Anemia   . Environmental allergies   . HPV in female   . IDA (iron deficiency anemia) 09/22/2020    Past Surgical History:  Procedure Laterality Date  . BARTHOLIN CYST MARSUPIALIZATION      Family History  Problem Relation Age of Onset  . Diabetes Mother   . Hypertension Mother   . Hypertension Father   . Diabetes Father   . Breast cancer Neg Hx   . Ovarian cancer Neg Hx   . Colon cancer Neg Hx     Social History   Socioeconomic History  . Marital status: Single    Spouse  name: Not on file  . Number of children: Not on file  . Years of education: Not on file  . Highest education level: Not on file  Occupational History  . Not on file  Tobacco Use  . Smoking status: Never Smoker  . Smokeless tobacco: Never Used  Vaping Use  . Vaping Use: Never used  Substance and Sexual Activity  . Alcohol use: No  . Drug use: No  . Sexual activity: Yes    Birth control/protection: None  Other Topics Concern  . Not on file  Social History Narrative  . Not on file   Social Determinants of Health   Financial Resource Strain: Not on file  Food Insecurity: Not on file  Transportation Needs: Not on file  Physical Activity: Not on file  Stress: Not on file  Social Connections: Not on file  Intimate Partner Violence: Not on file     Current Outpatient Medications:  .  cetirizine (ZYRTEC) 10 MG tablet, Take 10 mg by mouth daily., Disp: , Rfl:  .  Cholecalciferol 25 MCG (1000 UT) tablet, Take by mouth., Disp: , Rfl:  .  Cyanocobalamin 1000 MCG LOZG, Place under the tongue., Disp: , Rfl:  .  ergocalciferol (VITAMIN D2) 1.25 MG (50000 UT) capsule, Take 1 capsule by mouth once a week., Disp: , Rfl:  .  fluticasone (FLONASE) 50 MCG/ACT nasal spray, SPRAY 2 SPRAYS INTO EACH NOSTRIL EVERY DAY, Disp: , Rfl:  .  ibuprofen (ADVIL) 600 MG tablet, Take 1 tablet (600 mg total) by mouth every 6 (six) hours as needed., Disp: 30 tablet, Rfl: 0 .  metFORMIN (GLUCOPHAGE-XR) 500 MG 24 hr tablet, Take by mouth., Disp: , Rfl:  .  Multiple Vitamins-Minerals (MULTIVITAMIN ADULT EXTRA C PO), Take by mouth., Disp: , Rfl:  .  Norethindrone Acetate-Ethinyl Estrad-FE (MICROGESTIN 24 FE) 1-20 MG-MCG(24) tablet, Take 1 tablet by mouth daily., Disp: 84 tablet, Rfl: 3 .  pantoprazole (PROTONIX) 40 MG tablet, Take 1 tablet (40 mg total) by mouth daily., Disp: 30 tablet, Rfl: 2 .  Probiotic Product (PROBIOTIC-10 PO), Take by mouth., Disp: , Rfl:      ROS:  Review of Systems  Constitutional:  Negative for fatigue, fever and unexpected weight change.  Respiratory: Negative for cough, shortness of breath and wheezing.   Cardiovascular: Negative for chest pain, palpitations and leg swelling.  Gastrointestinal: Negative for blood in stool, constipation, diarrhea, nausea and vomiting.  Endocrine: Negative for cold intolerance, heat intolerance and polyuria.  Genitourinary: Positive for pelvic pain. Negative for dyspareunia, dysuria, flank pain, frequency, genital sores, hematuria, menstrual problem, urgency, vaginal bleeding, vaginal discharge and vaginal pain.  Musculoskeletal: Negative for back pain, joint swelling and myalgias.  Skin: Negative for rash.  Neurological: Negative for dizziness, syncope, light-headedness, numbness and headaches.  Hematological: Negative for adenopathy.  Psychiatric/Behavioral: Negative for agitation, confusion, sleep disturbance and suicidal ideas. The patient is not nervous/anxious.   BREAST: No symptoms   Objective: BP 140/90   Ht 5\' 3"  (1.6 m)   Wt 189 lb (85.7 kg)   LMP 12/30/2020 (Exact Date)   BMI 33.48 kg/m    Physical Exam Constitutional:      Appearance: She is well-developed.  Genitourinary:     Vulva normal.     Right Labia: No rash, tenderness or lesions.    Left Labia: No tenderness, lesions or rash.    No vaginal discharge, erythema or tenderness.      Right Adnexa: not tender and no mass present.    Left Adnexa: not tender and no mass present.    No cervical friability or polyp.     Uterus is not enlarged or tender.  Breasts:     Right: No mass, nipple discharge, skin change or tenderness.     Left: No mass, nipple discharge, skin change or tenderness.    Neck:     Thyroid: No thyromegaly.  Cardiovascular:     Rate and Rhythm: Normal rate and regular rhythm.     Heart sounds: Normal heart sounds. No murmur heard.   Pulmonary:     Effort: Pulmonary effort is normal.     Breath sounds: Normal breath sounds.   Abdominal:     Palpations: Abdomen is soft.     Tenderness: There is no abdominal tenderness. There is no guarding or rebound.  Musculoskeletal:        General: Normal range of motion.     Cervical back: Normal range of motion.  Lymphadenopathy:     Cervical: No cervical adenopathy.  Neurological:     General: No focal deficit present.     Mental Status: She is alert and oriented to person, place, and time.     Cranial Nerves: No cranial nerve deficit.  Skin:    General: Skin is warm and dry.  Psychiatric:  Mood and Affect: Mood normal.        Behavior: Behavior normal.        Thought Content: Thought content normal.        Judgment: Judgment normal.  Vitals reviewed.     Assessment/Plan: Encounter for annual routine gynecological examination  Cervical cancer screening - Plan: Cytology - PAP  Screening for HPV (human papillomavirus) - Plan: Cytology - PAP  Encounter for initial prescription of contraceptive pills - Plan: Norethindrone Acetate-Ethinyl Estrad-FE (MICROGESTIN 24 FE) 1-20 MG-MCG(24) tablet; BC options discussed, pt doesn't want depo or IUD. Will try OCPs, start with next menses. Rx eRxd. Condoms for 1 wk. F/u prn.   Menorrhagia with regular cycle - Plan: Norethindrone Acetate-Ethinyl Estrad-FE (MICROGESTIN 24 FE) 1-20 MG-MCG(24) tablet; try OCPs, cont hematology f/u. F/u prn.   Meds ordered this encounter  Medications  . Norethindrone Acetate-Ethinyl Estrad-FE (MICROGESTIN 24 FE) 1-20 MG-MCG(24) tablet    Sig: Take 1 tablet by mouth daily.    Dispense:  84 tablet    Refill:  3    Order Specific Question:   Supervising Provider    Answer:   Nadara Mustard [[id number]]             GYN counsel family planning choices, adequate intake of calcium and vitamin D, diet and exercise     F/U  Return in about 1 year (around 01/08/2022).  Praneeth Bussey B. Elodie Panameno, PA-C 01/08/2021 4:35 PM

## 2021-01-08 NOTE — Patient Instructions (Signed)
I value your feedback and you entrusting us with your care. If you get a [facility] patient survey, I would appreciate you taking the time to let us know about your experience today. Thank you! ? ? ?

## 2021-01-12 ENCOUNTER — Telehealth: Payer: Self-pay | Admitting: Gastroenterology

## 2021-01-12 DIAGNOSIS — R1310 Dysphagia, unspecified: Secondary | ICD-10-CM

## 2021-01-12 LAB — CYTOLOGY - PAP
Comment: NEGATIVE
Diagnosis: NEGATIVE
High risk HPV: NEGATIVE

## 2021-01-12 NOTE — Telephone Encounter (Signed)
Patient states she was seen by the ER. She states when she seen in the office and states since the increase to pantoprazole 40mg  she had constipation, abdominal cramping, back and leg pain. She states she stopped taking the medication after a week. She began taking otc  Pepcid AC and Mylanta 20mg  and drinking tea at night. She states her symptoms have improved but still having some leg and back pain

## 2021-01-12 NOTE — Telephone Encounter (Signed)
Patient wants call back- having problems with Protonix 40 mg.  Causing stomach cramps, leg pain, lower back pain.  Please call to advise

## 2021-01-13 ENCOUNTER — Other Ambulatory Visit: Payer: Self-pay

## 2021-01-13 DIAGNOSIS — R252 Cramp and spasm: Secondary | ICD-10-CM | POA: Insufficient documentation

## 2021-01-13 DIAGNOSIS — R109 Unspecified abdominal pain: Secondary | ICD-10-CM | POA: Diagnosis present

## 2021-01-13 DIAGNOSIS — K5900 Constipation, unspecified: Secondary | ICD-10-CM | POA: Diagnosis not present

## 2021-01-13 DIAGNOSIS — R110 Nausea: Secondary | ICD-10-CM | POA: Diagnosis not present

## 2021-01-13 NOTE — Telephone Encounter (Signed)
Recommend to check following labs  CMP, total CK  Thanks RV

## 2021-01-13 NOTE — Telephone Encounter (Addendum)
Called and left a message for call back. Order the labs

## 2021-01-13 NOTE — Telephone Encounter (Signed)
Patient called back LVM to return her call

## 2021-01-13 NOTE — ED Triage Notes (Signed)
Pt states she was seen here last week for abd pain and prescribed Protonix, pt followed up with GI and had an increase to the dose she was taking, pt states since then her legs have felt sore and her back started hurting as well. Pt denies n/v/d.

## 2021-01-13 NOTE — Telephone Encounter (Signed)
Patient verbalized understanding and will get the lab work done

## 2021-01-13 NOTE — Addendum Note (Signed)
Addended by: Radene Knee L on: 01/13/2021 01:27 PM   Modules accepted: Orders

## 2021-01-14 ENCOUNTER — Ambulatory Visit: Admit: 2021-01-14 | Payer: PRIVATE HEALTH INSURANCE | Admitting: Gastroenterology

## 2021-01-14 ENCOUNTER — Telehealth: Payer: Self-pay | Admitting: Oncology

## 2021-01-14 ENCOUNTER — Emergency Department
Admission: EM | Admit: 2021-01-14 | Discharge: 2021-01-14 | Disposition: A | Discharge status: Home / Self Care | Payer: PRIVATE HEALTH INSURANCE | Attending: Emergency Medicine | Admitting: Emergency Medicine

## 2021-01-14 ENCOUNTER — Emergency Department: Payer: PRIVATE HEALTH INSURANCE

## 2021-01-14 ENCOUNTER — Inpatient Hospital Stay: Payer: PRIVATE HEALTH INSURANCE

## 2021-01-14 DIAGNOSIS — K5900 Constipation, unspecified: Secondary | ICD-10-CM

## 2021-01-14 DIAGNOSIS — R252 Cramp and spasm: Secondary | ICD-10-CM

## 2021-01-14 LAB — COMPREHENSIVE METABOLIC PANEL
ALT: 12 U/L (ref 0–44)
AST: 16 U/L (ref 15–41)
Albumin: 4.3 g/dL (ref 3.5–5.0)
Alkaline Phosphatase: 93 U/L (ref 38–126)
Anion gap: 11 (ref 5–15)
BUN: 11 mg/dL (ref 6–20)
CO2: 23 mmol/L (ref 22–32)
Calcium: 10 mg/dL (ref 8.9–10.3)
Chloride: 101 mmol/L (ref 98–111)
Creatinine, Ser: 0.76 mg/dL (ref 0.44–1.00)
GFR, Estimated: >60 mL/min (ref >60)
Glucose, Bld: 111 mg/dL — ABNORMAL HIGH (ref 70–99)
Potassium: 4 mmol/L (ref 3.5–5.1)
Sodium: 135 mmol/L (ref 135–145)
Total Bilirubin: 0.3 mg/dL (ref 0.3–1.2)
Total Protein: 8.1 g/dL (ref 6.5–8.1)

## 2021-01-14 LAB — CBC
HCT: 39 % (ref 36.0–46.0)
Hemoglobin: 12.5 g/dL (ref 12.0–15.0)
MCH: 26.7 pg (ref 26.0–34.0)
MCHC: 32.1 g/dL (ref 30.0–36.0)
MCV: 83.3 fL (ref 80.0–100.0)
Platelets: 469 10*3/uL — ABNORMAL HIGH (ref 150–400)
RBC: 4.68 MIL/uL (ref 3.87–5.11)
RDW: 15.5 % (ref 11.5–15.5)
WBC: 10.6 10*3/uL — ABNORMAL HIGH (ref 4.0–10.5)
nRBC: 0 % (ref 0.0–0.2)

## 2021-01-14 LAB — LIPASE, BLOOD: Lipase: 29 U/L (ref 11–51)

## 2021-01-14 LAB — URINALYSIS, COMPLETE (UACMP) WITH MICROSCOPIC
Bacteria, UA: NONE SEEN
Bilirubin Urine: NEGATIVE
Glucose, UA: NEGATIVE mg/dL
Ketones, ur: NEGATIVE mg/dL
Leukocytes,Ua: NEGATIVE
Nitrite: NEGATIVE
Protein, ur: NEGATIVE mg/dL
Specific Gravity, Urine: 1.004 — ABNORMAL LOW (ref 1.005–1.030)
pH: 6 (ref 5.0–8.0)

## 2021-01-14 LAB — MAGNESIUM: Magnesium: 2.4 mg/dL (ref 1.7–2.4)

## 2021-01-14 LAB — CK: Total CK: 54 U/L (ref 38–234)

## 2021-01-14 LAB — POC URINE PREG, ED: Preg Test, Ur: NEGATIVE

## 2021-01-14 SURGERY — ESOPHAGOGASTRODUODENOSCOPY (EGD) WITH PROPOFOL
Anesthesia: General

## 2021-01-14 MED ORDER — ONDANSETRON 4 MG PO TBDP
4.0000 mg | ORAL_TABLET | Freq: Once | ORAL | Status: DC
  Filled 2021-01-14: qty 1

## 2021-01-14 MED ORDER — DICYCLOMINE HCL 10 MG PO CAPS
20.0000 mg | ORAL_CAPSULE | Freq: Once | ORAL | Status: AC
  Administered 2021-01-14: 20 mg via ORAL
  Filled 2021-01-14: qty 2

## 2021-01-14 MED ORDER — DICYCLOMINE HCL 20 MG PO TABS: 20.0000 mg | ORAL_TABLET | Freq: Three times a day (TID) | ORAL | 0 refills | Status: DC | PRN

## 2021-01-14 MED ORDER — ONDANSETRON 4 MG PO TBDP: 4.0000 mg | ORAL_TABLET | Freq: Four times a day (QID) | ORAL | 0 refills | Status: DC | PRN

## 2021-01-14 NOTE — Telephone Encounter (Signed)
Pt left VM with answering service to please cancel all upcoming appts. Pt stated that she cannot afford to receive treatment and would like to pay off her current medical bills before making any appointments.

## 2021-01-14 NOTE — ED Notes (Signed)
Patient transported to X-ray 

## 2021-01-14 NOTE — Discharge Instructions (Addendum)
Please follow-up with your gastroenterologist.   I recommend that you increase your water and fiber intake. If you are not able to eat foods high in fiber, you may use Benefiber or Metamucil over-the-counter. I also recommend you use MiraLAX 1-2 times a day and Colace 100 mg twice a day to help with bowel movements. These medications are over the counter.  You may use other over-the-counter medications such as Dulcolax, Fleet enemas, magnesium citrate as needed for constipation. Please note that some of these medications may cause you to have abdominal cramping which is normal. If you develop severe abdominal pain, fever (temperature of 100.4 or higher), persistent vomiting, distention of your abdomen, unable to have a bowel movement for 5 days or are not passing gas, please return to the hospital.  You may take over-the-counter Tylenol 1000 mg every 6 hours as needed for your leg cramps.  Your labs, urine were reassuring today.

## 2021-01-14 NOTE — ED Provider Notes (Signed)
[facility] Emergency Department Provider Note  ____________________________________________   Event Date/Time   First MD Initiated Contact with Patient 01/14/21 0308     (approximate)  I have reviewed the triage vital signs and the nursing notes.   HISTORY  Chief Complaint Abdominal Pain and Back Pain    HPI Tina Simon is a 35 y.o. female with history of iron deficiency anemia who presents to the emergency department with complaints of left-sided crampy abdominal pain that radiates into her back and bilateral leg aching.  She states symptoms all started about 3 to 4 weeks ago when she had sinusitis and finished antibiotics.  Afterwards she had some dysphagia and was started on Protonix for possible GERD.  She states she saw GI as an outpatient and they increased her Protonix.  She states after increasing her Protonix she started having left-sided crampy abdominal pain, constipation and leg aching.  She has stopped her Protonix in the past week but her symptoms have not resolved.  She did take Metamucil for her constipation has been able to have a bowel movement.  Is passing gas.  She states she has nausea without vomiting.  No bloody stools or melena.  No fever.  No dysuria, hematuria, vaginal bleeding or discharge.  No previous abdominal surgery.  No aggravating or alleviating factors.  No injury to her legs.  No numbness or focal weakness.  States pain is mostly from the knees down bilaterally.  No calf tenderness or calf swelling.  She states she called her gastroenterologist who recommended blood work which has not yet been done.  It appears per notes, GI recommended CMP and CK.        Past Medical History:  Diagnosis Date  . Anemia   . Environmental allergies   . HPV in female   . IDA (iron deficiency anemia) 09/22/2020    Patient Active Problem List   Diagnosis Date Noted  . IDA (iron deficiency anemia) 09/22/2020  . Abnormal EKG 09/18/2020  .  Bartholin cyst 09/18/2020  . Elevated blood pressure reading without diagnosis of hypertension 09/18/2020  . Family history of heart murmur 09/18/2020  . History of abnormal cervical Pap smear 09/29/2017  . Heart murmur 04/14/2012    Past Surgical History:  Procedure Laterality Date  . BARTHOLIN CYST MARSUPIALIZATION      Prior to Admission medications   Medication Sig Start Date End Date Taking? Authorizing Provider  dicyclomine (BENTYL) 20 MG tablet Take 1 tablet (20 mg total) by mouth every 8 (eight) hours as needed for spasms (Abdominal cramping). 01/14/21  Yes Kanaan Kagawa N, DO  ondansetron (ZOFRAN ODT) 4 MG disintegrating tablet Take 1 tablet (4 mg total) by mouth every 6 (six) hours as needed for nausea or vomiting. 01/14/21  Yes Tirth Cothron, Layla Maw, DO  cetirizine (ZYRTEC) 10 MG tablet Take 10 mg by mouth daily. 08/13/20   [provider]  Cholecalciferol 25 MCG (1000 UT) tablet Take by mouth.    [provider]  Cyanocobalamin 1000 MCG LOZG Place under the tongue.    [provider]  ergocalciferol (VITAMIN D2) 1.25 MG (50000 UT) capsule Take 1 capsule by mouth once a week. 07/29/15   [provider]  fluticasone (FLONASE) 50 MCG/ACT nasal spray SPRAY 2 SPRAYS INTO EACH NOSTRIL EVERY DAY 12/25/20   [provider]  ibuprofen (ADVIL) 600 MG tablet Take 1 tablet (600 mg total) by mouth every 6 (six) hours as needed. 12/23/20   Alycia Rossetti,  Riki Rusk, NP  metFORMIN (GLUCOPHAGE-XR) 500 MG 24 hr tablet Take by mouth. 06/11/20 06/11/21  [provider]  Multiple Vitamins-Minerals (MULTIVITAMIN ADULT EXTRA C PO) Take by mouth.    [provider]  Norethindrone Acetate-Ethinyl Estrad-FE (MICROGESTIN 24 FE) 1-20 MG-MCG(24) tablet Take 1 tablet by mouth daily. 01/08/21   Copland, Ilona Sorrel, PA-C  pantoprazole (PROTONIX) 40 MG tablet Take 1 tablet (40 mg total) by mouth daily. 12/29/20   Toney Reil, MD  Probiotic Product (PROBIOTIC-10 PO) Take  by mouth.    [provider]  ferrous sulfate 325 (65 FE) MG tablet Take 325 mg by mouth daily. 08/18/20 12/18/20  [provider]  triamcinolone (NASACORT) 55 MCG/ACT AERO nasal inhaler Place 2 sprays into the nose daily.  12/18/20  [provider]    Allergies Patient has no known allergies.  Family History  Problem Relation Age of Onset  . Diabetes Mother   . Hypertension Mother   . Hypertension Father   . Diabetes Father   . Breast cancer Neg Hx   . Ovarian cancer Neg Hx   . Colon cancer Neg Hx     Social History Social History   Tobacco Use  . Smoking status: Never Smoker  . Smokeless tobacco: Never Used  Vaping Use  . Vaping Use: Never used  Substance Use Topics  . Alcohol use: No  . Drug use: No    Review of Systems Constitutional: No fever. Eyes: No visual changes. ENT: No sore throat. Cardiovascular: Denies chest pain. Respiratory: Denies shortness of breath. Gastrointestinal: No nausea, vomiting, diarrhea. Genitourinary: Negative for dysuria. Musculoskeletal: Negative for back pain. Skin: Negative for rash. Neurological: Negative for focal weakness or numbness.  ____________________________________________   PHYSICAL EXAM:  VITAL SIGNS: ED Triage Vitals  Enc Vitals Group     BP 01/13/21 2359 (!) 165/103     Pulse Rate 01/13/21 2359 100     Resp 01/13/21 2359 16     Temp 01/13/21 2359 98.1 F (36.7 C)     Temp src --      SpO2 01/13/21 2359 99 %     Weight 01/13/21 2357 175 lb (79.4 kg)     Height 01/13/21 2357 5\' 3"  (1.6 m)     Head Circumference --      Peak Flow --      Pain Score 01/13/21 2357 8     Pain Loc --      Pain Edu? --      Excl. in GC? --    CONSTITUTIONAL: Alert and oriented and responds appropriately to questions. Well-appearing; well-nourished HEAD: Normocephalic EYES: Conjunctivae clear, pupils appear equal, EOM appear intact ENT: normal nose; moist mucous membranes NECK: Supple, normal  ROM CARD: RRR; S1 and S2 appreciated; no murmurs, no clicks, no rubs, no gallops RESP: Normal chest excursion without splinting or tachypnea; breath sounds clear and equal bilaterally; no wheezes, no rhonchi, no rales, no hypoxia or respiratory distress, speaking full sentences ABD/GI: Normal bowel sounds; non-distended; soft, non-tender, no rebound, no guarding, no peritoneal signs, no hepatosplenomegaly BACK: The back appears normal, no midline spinal tenderness or step-off or deformity EXT: Normal ROM in all joints; no deformity noted, no edema; no cyanosis, no calf tenderness or calf swelling, no joint effusions, no redness or warmth, compartments are soft, 2+ DP pulses bilaterally SKIN: Normal color for age and race; warm; no rash on exposed skin NEURO: Moves all extremities equally PSYCH: The patient's mood and manner are appropriate.  ____________________________________________   LABS (all labs ordered are listed, but only abnormal results are displayed)  Labs Reviewed  COMPREHENSIVE METABOLIC PANEL - Abnormal; Notable for the following components:      Result Value   Glucose, Bld 111 (*)    All other components within normal limits  CBC - Abnormal; Notable for the following components:   WBC 10.6 (*)    Platelets 469 (*)    All other components within normal limits  URINALYSIS, COMPLETE (UACMP) WITH MICROSCOPIC - Abnormal; Notable for the following components:   Color, Urine STRAW (*)    APPearance HAZY (*)    Specific Gravity, Urine 1.004 (*)    Hgb urine dipstick SMALL (*)    All other components within normal limits  LIPASE, BLOOD  CK  MAGNESIUM  POC URINE PREG, ED   ____________________________________________  EKG  none ____________________________________________  RADIOLOGY I, Willeen Novak, personally viewed and evaluated these images (plain radiographs) as part of my medical decision making, as well as reviewing the written report by the radiologist.  ED  MD interpretation: X-ray shows constipation without impaction or obstruction.  Official radiology report(s): DG Abd 1 View  Result Date: 01/14/2021 CLINICAL DATA:  Left-sided abdominal pain with suspected constipation EXAM: ABDOMEN - 1 VIEW COMPARISON:  None. FINDINGS: Formed stool seen in the ascending, transverse, and descending colon. No rectal impaction or small bowel obstruction. No concerning mass effect or gas collection. IMPRESSION: Moderate colonic stool retention.  No impaction or obstruction. Electronically Signed   By: Marnee Spring M.D.   On: 01/14/2021 04:01    ____________________________________________   PROCEDURES  Procedure(s) performed (including Critical Care):  Procedures  ____________________________________________   INITIAL IMPRESSION / ASSESSMENT AND PLAN / ED COURSE  As part of my medical decision making, I reviewed the following data within the electronic MEDICAL RECORD NUMBER Nursing notes reviewed and incorporated, Labs reviewed , Radiograph reviewed  and Notes from prior ED visits         Patient here with complaints of increasing left-sided abdominal pain, constipation and leg aching after being started on Protonix and having her dose increase.  She has been off Protonix for several days. I suspect that her abdominal discomfort is due to constipation.  Doubt bowel obstruction.  Will obtain x-ray.  Will give Bentyl, Zofran.  Her labs are otherwise reassuring.  Normal LFTs, lipase.  Urine does not show any sign of infection other than 6-10 red blood cells but also many squamous cells.  No bacteria seen.  She has no urinary symptoms.  Her pregnancy test is negative.  No tenderness at McBurney's point.  No tenderness in the right upper quadrant.  As for her leg achiness, no signs of cellulitis, gout, septic arthritis, compartment syndrome, fracture, DVT, arterial obstruction.  Her gastroenterologist recommended to CMP and CK level.  We will also add on a  magnesium.    ED PROGRESS  Patient CK is normal.  Magnesium normal.  Unclear etiology for patient's leg achiness.  Have advised her to follow-up with her PCP.  Her abdominal x-ray shows constipation.  Have advised her to start a bowel regimen at home.  Recommended Tylenol as needed for discomfort.  She has GI follow-up as an outpatient.  Discussed return precautions.  Will discharge with prescription of Bentyl and Zofran.  At this time, I do not feel there is any life-threatening condition present. I have reviewed, interpreted and discussed all results (EKG, imaging, lab, urine as appropriate) and exam findings with  patient/family. I have reviewed nursing notes and appropriate previous records.  I feel the patient is safe to be discharged home without further emergent workup and can continue workup as an outpatient as needed. Discussed usual and customary return precautions. Patient/family verbalize understanding and are comfortable with this plan.  Outpatient follow-up has been provided as needed. All questions have been answered.  ____________________________________________   FINAL CLINICAL IMPRESSION(S) / ED DIAGNOSES  Final diagnoses:  Constipation, unspecified constipation type  Leg cramps     ED Discharge Orders         Ordered    dicyclomine (BENTYL) 20 MG tablet  Every 8 hours PRN        01/14/21 0429    ondansetron (ZOFRAN ODT) 4 MG disintegrating tablet  Every 6 hours PRN        01/14/21 0429          *Please note:  [place] DomKeshia L Bracy was evaluated in Emergency Department on 01/14/2021 for the symptoms described in the history of present illness. She was evaluated in the context of the global COVID-19 pandemic, which necessitated consideration that the patient might be at risk for infection with the SARS-CoV-2 virus that causes COVID-19. Institutional protocols and algorithms that pertain to the evaluation of patients at risk for COVID-19 are in a state of rapid change based on  information released by regulatory bodies including the CDC and federal and state organizations. These policies and algorithms were followed during the patient's care in the ED.  Some ED evaluations and interventions may be delayed as a result of limited staffing during and the pandemic.*   Note:  This document was prepared using Dragon voice recognition software and may include unintentional dictation errors.   Shakirra Buehler, Layla MawKristen N, DO 01/14/21 [phone]

## 2021-01-16 ENCOUNTER — Inpatient Hospital Stay: Payer: PRIVATE HEALTH INSURANCE

## 2021-01-16 ENCOUNTER — Inpatient Hospital Stay: Payer: PRIVATE HEALTH INSURANCE | Admitting: Oncology

## 2021-01-27 ENCOUNTER — Telehealth: Payer: Self-pay | Admitting: Gastroenterology

## 2021-01-27 NOTE — Telephone Encounter (Signed)
Patient states it is mostly with meat. Patient states she had a boil egg and Banana  and feels like something is stuck on the right side and ate a hour ago. Patient uses cough drops after she eats and does not complain of any sore throat or anything. She states she went the the ER last week and they gave her dicyclomine for abdominal pain and Zofran. She states she uses a laxative last week for constipation. She is still having some abdominal pain but is on her period so does not know if it is from the period or her regular abdominal pain. Informed patient you were on vacation but would respond to her when you get back to me

## 2021-01-27 NOTE — Telephone Encounter (Signed)
Patient called asking for call back .   Patient still having issues with swallowing.  Is using Pecid AC twice daily.  Wants to know if she needs another medication to help with symptoms.

## 2021-01-28 ENCOUNTER — Other Ambulatory Visit: Payer: Self-pay

## 2021-01-28 ENCOUNTER — Ambulatory Visit: Payer: PRIVATE HEALTH INSURANCE | Admitting: Obstetrics and Gynecology

## 2021-01-28 DIAGNOSIS — R1310 Dysphagia, unspecified: Secondary | ICD-10-CM

## 2021-01-28 MED ORDER — OMEPRAZOLE 20 MG PO CPDR: 20.0000 mg | DELAYED_RELEASE_CAPSULE | Freq: Two times a day (BID) | ORAL | 0 refills | Status: DC

## 2021-01-28 NOTE — Telephone Encounter (Signed)
She can try omeprazole 20mg  BID before meals I recommended egd and she cancelled it before, she needs one   Dicyclomine can cause constipation. She can try miralax 17gm BID for constipation  RV

## 2021-01-28 NOTE — Telephone Encounter (Signed)
Patient verbalized understanding of instructions. She made a EGD for 02/05/2021 went over instructions with patient and sent to mychart account. Sent medication to the pharmacy

## 2021-01-28 NOTE — Addendum Note (Signed)
Addended by: Radene Knee L on: 01/28/2021 08:33 AM   Modules accepted: Orders

## 2021-01-29 ENCOUNTER — Telehealth: Payer: Self-pay | Admitting: Gastroenterology

## 2021-01-29 NOTE — Telephone Encounter (Signed)
Patient LVM for call back with questions regarding her upcoming procedure scheduled on 02/05/21

## 2021-01-29 NOTE — Telephone Encounter (Signed)
Patient was calling to find out if she could get note to be out of work from Monday to Thursday. Informed patient that we could not that the hospital can write her a note that she had her procedure on Thursday and can return to work on Friday but can only do Thursday

## 2021-02-03 ENCOUNTER — Other Ambulatory Visit: Payer: Self-pay

## 2021-02-03 ENCOUNTER — Other Ambulatory Visit
Admission: RE | Admit: 2021-02-03 | Discharge: 2021-02-03 | Disposition: A | Discharge status: Home / Self Care | Payer: PRIVATE HEALTH INSURANCE | Source: Ambulatory Visit | Attending: Gastroenterology | Admitting: Gastroenterology

## 2021-02-03 DIAGNOSIS — Z01812 Encounter for preprocedural laboratory examination: Secondary | ICD-10-CM | POA: Insufficient documentation

## 2021-02-03 DIAGNOSIS — Z20822 Contact with and (suspected) exposure to covid-19: Secondary | ICD-10-CM | POA: Diagnosis not present

## 2021-02-03 LAB — SARS CORONAVIRUS 2 (TAT 6-24 HRS): SARS Coronavirus 2: NEGATIVE

## 2021-02-05 ENCOUNTER — Encounter: Payer: Self-pay | Admitting: Gastroenterology

## 2021-02-05 ENCOUNTER — Encounter
Admission: RE | Disposition: A | Discharge status: Home / Self Care | Payer: Self-pay | Source: Ambulatory Visit | Attending: Gastroenterology

## 2021-02-05 ENCOUNTER — Ambulatory Visit
Admission: RE | Admit: 2021-02-05 | Discharge: 2021-02-05 | Disposition: A | Discharge status: Home / Self Care | Payer: PRIVATE HEALTH INSURANCE | Source: Ambulatory Visit | Attending: Gastroenterology | Admitting: Gastroenterology

## 2021-02-05 ENCOUNTER — Other Ambulatory Visit: Payer: Self-pay

## 2021-02-05 ENCOUNTER — Ambulatory Visit: Payer: PRIVATE HEALTH INSURANCE | Admitting: Certified Registered"

## 2021-02-05 DIAGNOSIS — R1314 Dysphagia, pharyngoesophageal phase: Secondary | ICD-10-CM | POA: Insufficient documentation

## 2021-02-05 DIAGNOSIS — R1310 Dysphagia, unspecified: Secondary | ICD-10-CM

## 2021-02-05 DIAGNOSIS — Z793 Long term (current) use of hormonal contraceptives: Secondary | ICD-10-CM | POA: Insufficient documentation

## 2021-02-05 DIAGNOSIS — K219 Gastro-esophageal reflux disease without esophagitis: Secondary | ICD-10-CM | POA: Insufficient documentation

## 2021-02-05 DIAGNOSIS — K2900 Acute gastritis without bleeding: Secondary | ICD-10-CM | POA: Diagnosis not present

## 2021-02-05 DIAGNOSIS — Z8249 Family history of ischemic heart disease and other diseases of the circulatory system: Secondary | ICD-10-CM | POA: Diagnosis not present

## 2021-02-05 DIAGNOSIS — Z833 Family history of diabetes mellitus: Secondary | ICD-10-CM | POA: Diagnosis not present

## 2021-02-05 DIAGNOSIS — Z7984 Long term (current) use of oral hypoglycemic drugs: Secondary | ICD-10-CM | POA: Insufficient documentation

## 2021-02-05 DIAGNOSIS — Z791 Long term (current) use of non-steroidal anti-inflammatories (NSAID): Secondary | ICD-10-CM | POA: Insufficient documentation

## 2021-02-05 DIAGNOSIS — E119 Type 2 diabetes mellitus without complications: Secondary | ICD-10-CM | POA: Diagnosis not present

## 2021-02-05 DIAGNOSIS — Z79899 Other long term (current) drug therapy: Secondary | ICD-10-CM | POA: Insufficient documentation

## 2021-02-05 HISTORY — PX: ESOPHAGOGASTRODUODENOSCOPY (EGD) WITH PROPOFOL: SHX5813

## 2021-02-05 HISTORY — DX: Gastro-esophageal reflux disease without esophagitis: K21.9

## 2021-02-05 HISTORY — DX: Type 2 diabetes mellitus without complications: E11.9

## 2021-02-05 LAB — GLUCOSE, CAPILLARY: Glucose-Capillary: 98 mg/dL (ref 70–99)

## 2021-02-05 LAB — POCT PREGNANCY, URINE: Preg Test, Ur: NEGATIVE

## 2021-02-05 SURGERY — ESOPHAGOGASTRODUODENOSCOPY (EGD) WITH PROPOFOL
Anesthesia: General

## 2021-02-05 MED ORDER — SODIUM CHLORIDE 0.9 % IV SOLN: INTRAVENOUS | Status: DC

## 2021-02-05 MED ORDER — LIDOCAINE HCL (CARDIAC) PF 100 MG/5ML IV SOSY
PREFILLED_SYRINGE | INTRAVENOUS | Status: DC | PRN
  Administered 2021-02-05: 25 mg via INTRAVENOUS

## 2021-02-05 MED ORDER — PROPOFOL 500 MG/50ML IV EMUL
INTRAVENOUS | Status: AC
  Filled 2021-02-05: qty 50

## 2021-02-05 MED ORDER — PROPOFOL 500 MG/50ML IV EMUL
INTRAVENOUS | Status: DC | PRN
  Administered 2021-02-05: 125 ug/kg/min via INTRAVENOUS

## 2021-02-05 MED ORDER — PROPOFOL 10 MG/ML IV BOLUS
INTRAVENOUS | Status: DC | PRN
  Administered 2021-02-05: 70 mg via INTRAVENOUS
  Administered 2021-02-05: 30 mg via INTRAVENOUS

## 2021-02-05 NOTE — Anesthesia Preprocedure Evaluation (Signed)
Anesthesia Evaluation  Patient identified by MRN, date of birth, ID band Patient awake    Reviewed: Allergy & Precautions, H&P , NPO status , Patient's Chart, lab work & pertinent test results  History of Anesthesia Complications Negative for: history of anesthetic complications  Airway Mallampati: II  TM Distance: >3 FB Neck ROM: full    Dental  (+) Teeth Intact   Pulmonary neg pulmonary ROS, neg sleep apnea, neg COPD,    breath sounds clear to auscultation       Cardiovascular (-) angina(-) Past MI and (-) Cardiac Stents negative cardio ROS  (-) dysrhythmias  Rhythm:regular Rate:Normal     Neuro/Psych negative neurological ROS  negative psych ROS   GI/Hepatic Neg liver ROS, GERD  Controlled,  Endo/Other    Renal/GU negative Renal ROS  negative genitourinary   Musculoskeletal   Abdominal   Peds  Hematology negative hematology ROS (+)   Anesthesia Other Findings Obesity  Past Medical History: No date: Anemia No date: Diabetes mellitus without complication (HCC)     Comment:  pre diabetes No date: Environmental allergies No date: GERD (gastroesophageal reflux disease) No date: HPV in female 09/22/2020: IDA (iron deficiency anemia)  Past Surgical History: No date: BARTHOLIN CYST MARSUPIALIZATION  BMI    Body Mass Index: 31.71 kg/m      Reproductive/Obstetrics negative OB ROS                             Anesthesia Physical Anesthesia Plan  ASA: II  Anesthesia Plan: General   Post-op Pain Management:    Induction:   PONV Risk Score and Plan: Propofol infusion and TIVA  Airway Management Planned:   Additional Equipment:   Intra-op Plan:   Post-operative Plan:   Informed Consent: I have reviewed the patients History and Physical, chart, labs and discussed the procedure including the risks, benefits and alternatives for the proposed anesthesia with the patient or  authorized representative who has indicated his/her understanding and acceptance.     Dental Advisory Given  Plan Discussed with: Anesthesiologist, CRNA and Surgeon  Anesthesia Plan Comments:         Anesthesia Quick Evaluation

## 2021-02-05 NOTE — Progress Notes (Signed)
No risk at this time. 

## 2021-02-05 NOTE — Op Note (Signed)
[facility] Patient Name: Tina Simon Procedure Date: 02/05/2021 8:57 AM MRN: [id number] Account #: [id number] Date of Birth: 1986-06-14 Admit Type: Outpatient Age: 35 Room: [facility] ENDO ROOM 3 Gender: Female Note Status: Finalized Procedure:             Upper GI endoscopy Indications:           Esophageal dysphagia Providers:             Toney Reil MD, MD Medicines:             General Anesthesia Complications:         No immediate complications. Estimated blood loss: None. Procedure:             Pre-Anesthesia Assessment:                        - Prior to the procedure, a History and Physical was                         performed, and patient medications and allergies were                         reviewed. The patient is competent. The risks and                         benefits of the procedure and the sedation options and                         risks were discussed with the patient. All questions                         were answered and informed consent was obtained.                         Patient identification and proposed procedure were                         verified by the physician, the nurse, the                         anesthesiologist, the anesthetist and the technician                         in the pre-procedure area in the procedure room in the                         endoscopy suite. Mental Status Examination: alert and                         oriented. Airway Examination: normal oropharyngeal                         airway and neck mobility. Respiratory Examination:                         clear to auscultation. CV Examination: normal.                         Prophylactic Antibiotics: The patient does not require  prophylactic antibiotics. Prior Anticoagulants: The                         patient has taken no previous anticoagulant or                         antiplatelet agents. ASA Grade  Assessment: II - A                         patient with mild systemic disease. After reviewing                         the risks and benefits, the patient was deemed in                         satisfactory condition to undergo the procedure. The                         anesthesia plan was to use general anesthesia.                         Immediately prior to administration of medications,                         the patient was re-assessed for adequacy to receive                         sedatives. The heart rate, respiratory rate, oxygen                         saturations, blood pressure, adequacy of pulmonary                         ventilation, and response to care were monitored                         throughout the procedure. The physical status of the                         patient was re-assessed after the procedure.                        After obtaining informed consent, the endoscope was                         passed under direct vision. Throughout the procedure,                         the patient's blood pressure, pulse, and oxygen                         saturations were monitored continuously. The Endoscope                         was introduced through the mouth, and advanced to the                         second part of duodenum. The upper GI endoscopy was  accomplished without difficulty. The patient tolerated                         the procedure fairly well. Findings:      The duodenal bulb and second portion of the duodenum were normal.      Striped moderately erythematous mucosa without bleeding was found in the       gastric antrum. Biopsies were taken with a cold forceps for Helicobacter       pylori testing.      A few dispersed diminutive erosions with no bleeding and no stigmata of       recent bleeding were found in the gastric antrum.      The gastric body and incisura were normal. Biopsies were taken with a       cold forceps for  Helicobacter pylori testing.      The cardia and gastric fundus were normal on retroflexion.      Esophagogastric landmarks were identified: the gastroesophageal junction       was found at 33 cm from the incisors.      The gastroesophageal junction and examined esophagus were normal.       Biopsies were taken with a cold forceps for histology. Impression:            - Normal duodenal bulb and second portion of the                         duodenum.                        - Erythematous mucosa in the antrum. Biopsied.                        - Erosive gastropathy with no bleeding and no stigmata                         of recent bleeding.                        - Normal gastric body and incisura. Biopsied.                        - Esophagogastric landmarks identified.                        - Normal gastroesophageal junction and esophagus.                         Biopsied. Recommendation:        - Await pathology results.                        - Discharge patient to home (with escort).                        - Resume previous diet today.                        - Continue present medications.                        - Follow an antireflux regimen. Procedure Code(s):     --- Professional ---  18299, Esophagogastroduodenoscopy, flexible,                         transoral; with biopsy, single or multiple Diagnosis Code(s):     --- Professional ---                        K31.89, Other diseases of stomach and duodenum                        R13.14, Dysphagia, pharyngoesophageal phase CPT copyright 2019 American Medical Association. All rights reserved. The codes documented in this report are preliminary and upon coder review may  be revised to meet current compliance requirements. Dr. Libby Maw Toney Reil MD, MD 02/05/2021 9:16:50 AM This report has been signed electronically. Number of Addenda: 0 Note Initiated On: 02/05/2021 8:57 AM Estimated Blood Loss:   Estimated blood loss: none.      [facility]

## 2021-02-05 NOTE — Anesthesia Procedure Notes (Signed)
Procedure Name: MAC Date/Time: 02/05/2021 9:05 AM Performed by: Jerrye Noble, CRNA Pre-anesthesia Checklist: Patient identified, Emergency Drugs available, Suction available and Patient being monitored Patient Re-evaluated:Patient Re-evaluated prior to induction Oxygen Delivery Method: Nasal cannula

## 2021-02-05 NOTE — Transfer of Care (Signed)
Immediate Anesthesia Transfer of Care Note  Patient: Tina Simon  Procedure(s) Performed: ESOPHAGOGASTRODUODENOSCOPY (EGD) WITH PROPOFOL (N/A )  Patient Location: PACU and Endoscopy Unit  Anesthesia Type:General  Level of Consciousness: awake, drowsy and patient cooperative  Airway & Oxygen Therapy: Patient Spontanous Breathing  Post-op Assessment: Report given to RN and Post -op Vital signs reviewed and stable  Post vital signs: Reviewed and stable  Last Vitals:  Vitals Value Taken Time  BP 129/86 02/05/21 0919  Temp    Pulse 97 02/05/21 0919  Resp 20 02/05/21 0919  SpO2 100 % 02/05/21 0919  Vitals shown include unvalidated device data.  Last Pain:  Vitals:   02/05/21 0755  TempSrc: Temporal  PainSc: 0-No pain         Complications: No complications documented.

## 2021-02-05 NOTE — H&P (Signed)
Tina Repress, MD [address]  [address]  [place], [place] [address]  Main: [phone]  Fax: [phone] Pager: [phone]  Primary Care Physician:  Nira Retort Primary Gastroenterologist:  Dr. Arlyss Repress  Pre-Procedure History & Physical: HPI:  Tina Simon is a 35 y.o. female is here for an endoscopy.   Past Medical History:  Diagnosis Date  . Anemia   . Diabetes mellitus without complication (HCC)    pre diabetes  . Environmental allergies   . GERD (gastroesophageal reflux disease)   . HPV in female   . IDA (iron deficiency anemia) 09/22/2020    Past Surgical History:  Procedure Laterality Date  . BARTHOLIN CYST MARSUPIALIZATION      Prior to Admission medications   Medication Sig Start Date End Date Taking? Authorizing Provider  cetirizine (ZYRTEC) 10 MG tablet Take 10 mg by mouth daily. 08/13/20  Yes [provider]  Cholecalciferol 25 MCG (1000 UT) tablet Take by mouth.   Yes [provider]  Cyanocobalamin 1000 MCG LOZG Place under the tongue.   Yes [provider]  ergocalciferol (VITAMIN D2) 1.25 MG (50000 UT) capsule Take 1 capsule by mouth once a week. 07/29/15  Yes [provider]  fluticasone (FLONASE) 50 MCG/ACT nasal spray SPRAY 2 SPRAYS INTO EACH NOSTRIL EVERY DAY 12/25/20  Yes [provider]  Multiple Vitamins-Minerals (MULTIVITAMIN ADULT EXTRA C PO) Take by mouth.   Yes [provider]  omeprazole (PRILOSEC) 20 MG capsule Take 1 capsule (20 mg total) by mouth 2 (two) times daily before a meal. 01/28/21 02/27/21 Yes Rosana Farnell, Loel Dubonnet, MD  Probiotic Product (PROBIOTIC-10 PO) Take by mouth.   Yes [provider]  dicyclomine (BENTYL) 20 MG tablet Take 1 tablet (20 mg total) by mouth every 8 (eight) hours as needed for spasms (Abdominal cramping). 01/14/21   Ward, Layla Maw, DO  ibuprofen (ADVIL) 600 MG tablet Take 1 tablet (600 mg total) by mouth every 6 (six) hours as  needed. 12/23/20   Becky Augusta, NP  metFORMIN (GLUCOPHAGE-XR) 500 MG 24 hr tablet Take by mouth. 06/11/20 06/11/21  [provider]  Norethindrone Acetate-Ethinyl Estrad-FE (MICROGESTIN 24 FE) 1-20 MG-MCG(24) tablet Take 1 tablet by mouth daily. 01/08/21   Copland, Helmut Muster B, PA-C  ondansetron (ZOFRAN ODT) 4 MG disintegrating tablet Take 1 tablet (4 mg total) by mouth every 6 (six) hours as needed for nausea or vomiting. Patient not taking: Reported on 02/05/2021 01/14/21   Ward, Layla Maw, DO  pantoprazole (PROTONIX) 40 MG tablet Take 1 tablet (40 mg total) by mouth daily. 12/29/20   Toney Reil, MD  ferrous sulfate 325 (65 FE) MG tablet Take 325 mg by mouth daily. 08/18/20 12/18/20  [provider]  triamcinolone (NASACORT) 55 MCG/ACT AERO nasal inhaler Place 2 sprays into the nose daily.  12/18/20  [provider]    Allergies as of 01/28/2021  . (No Known Allergies)    Family History  Problem Relation Age of Onset  . Diabetes Mother   . Hypertension Mother   . Hypertension Father   . Diabetes Father   . Breast cancer Neg Hx   . Ovarian cancer Neg Hx   . Colon cancer Neg Hx     Social History   Socioeconomic History  . Marital status: Single    Spouse name: Not on file  . Number of children: Not on file  . Years of education: Not on file  . Highest education level:  Not on file  Occupational History  . Not on file  Tobacco Use  . Smoking status: Never Smoker  . Smokeless tobacco: Never Used  Vaping Use  . Vaping Use: Never used  Substance and Sexual Activity  . Alcohol use: No  . Drug use: No  . Sexual activity: Yes    Birth control/protection: None  Other Topics Concern  . Not on file  Social History Narrative  . Not on file   Social Determinants of Health   Financial Resource Strain: Not on file  Food Insecurity: Not on file  Transportation Needs: Not on file  Physical Activity: Not on file  Stress: Not on file  Social Connections:  Not on file  Intimate Partner Violence: Not on file    Review of Systems: See HPI, otherwise negative ROS  Physical Exam: BP (!) 152/89   Pulse (!) 107   Temp 97.6 F (36.4 C) (Temporal)   Resp 16   Ht 5\' 3"  (1.6 m)   Wt 81.2 kg   SpO2 100%   BMI 31.71 kg/m  General:   Alert,  pleasant and cooperative in NAD Head:  Normocephalic and atraumatic. Neck:  Supple; no masses or thyromegaly. Lungs:  Clear throughout to auscultation.    Heart:  Regular rate and rhythm. Abdomen:  Soft, nontender and nondistended. Normal bowel sounds, without guarding, and without rebound.   Neurologic:  Alert and  oriented x4;  grossly normal neurologically.  Impression/Plan: is here for an endoscopy to be performed for dysphagia  Risks, benefits, limitations, and alternatives regarding  endoscopy have been reviewed with the patient.  Questions have been answered.  All parties agreeable.   Georgia Dom, MD  02/05/2021, 8:16 AM

## 2021-02-05 NOTE — Anesthesia Postprocedure Evaluation (Signed)
Anesthesia Post Note  Patient: Tina Simon  Procedure(s) Performed: ESOPHAGOGASTRODUODENOSCOPY (EGD) WITH PROPOFOL (N/A )  Patient location during evaluation: PACU Anesthesia Type: General Level of consciousness: awake and alert Pain management: pain level controlled Vital Signs Assessment: post-procedure vital signs reviewed and stable Respiratory status: spontaneous breathing, nonlabored ventilation and respiratory function stable Cardiovascular status: blood pressure returned to baseline and stable Postop Assessment: no apparent nausea or vomiting Anesthetic complications: no   No complications documented.   Last Vitals:  Vitals:   02/05/21 0919 02/05/21 0939  BP: 129/86 (!) 129/92  Pulse:    Resp:    Temp: (!) 36.3 C   SpO2:      Last Pain:  Vitals:   02/05/21 0949  TempSrc:   PainSc: 0-No pain                 Karleen Hampshire

## 2021-02-06 ENCOUNTER — Encounter: Payer: Self-pay | Admitting: Gastroenterology

## 2021-02-06 LAB — SURGICAL PATHOLOGY

## 2021-02-09 ENCOUNTER — Other Ambulatory Visit: Payer: Self-pay

## 2021-02-09 ENCOUNTER — Telehealth: Payer: Self-pay

## 2021-02-09 ENCOUNTER — Other Ambulatory Visit
Admission: RE | Admit: 2021-02-09 | Discharge: 2021-02-09 | Disposition: A | Discharge status: Home / Self Care | Payer: PRIVATE HEALTH INSURANCE | Attending: Gastroenterology | Admitting: Gastroenterology

## 2021-02-09 DIAGNOSIS — K2970 Gastritis, unspecified, without bleeding: Secondary | ICD-10-CM

## 2021-02-09 DIAGNOSIS — D500 Iron deficiency anemia secondary to blood loss (chronic): Secondary | ICD-10-CM | POA: Insufficient documentation

## 2021-02-09 LAB — CBC WITH DIFFERENTIAL/PLATELET
Abs Immature Granulocytes: 0.04 10*3/uL (ref 0.00–0.07)
Basophils Absolute: 0 10*3/uL (ref 0.0–0.1)
Basophils Relative: 0 %
Eosinophils Absolute: 0.3 10*3/uL (ref 0.0–0.5)
Eosinophils Relative: 3 %
HCT: 34.2 % — ABNORMAL LOW (ref 36.0–46.0)
Hemoglobin: 11.2 g/dL — ABNORMAL LOW (ref 12.0–15.0)
Immature Granulocytes: 0 %
Lymphocytes Relative: 19 %
Lymphs Abs: 1.8 10*3/uL (ref 0.7–4.0)
MCH: 26.4 pg (ref 26.0–34.0)
MCHC: 32.7 g/dL (ref 30.0–36.0)
MCV: 80.7 fL (ref 80.0–100.0)
Monocytes Absolute: 0.5 10*3/uL (ref 0.1–1.0)
Monocytes Relative: 5 %
Neutro Abs: 6.5 10*3/uL (ref 1.7–7.7)
Neutrophils Relative %: 73 %
Platelets: 490 10*3/uL — ABNORMAL HIGH (ref 150–400)
RBC: 4.24 MIL/uL (ref 3.87–5.11)
RDW: 14 % (ref 11.5–15.5)
WBC: 9.1 10*3/uL (ref 4.0–10.5)
nRBC: 0 % (ref 0.0–0.2)

## 2021-02-09 LAB — IRON AND TIBC
Iron: 26 ug/dL — ABNORMAL LOW (ref 28–170)
Saturation Ratios: 5 % — ABNORMAL LOW (ref 10.4–31.8)
TIBC: 490 ug/dL — ABNORMAL HIGH (ref 250–450)
UIBC: 464 ug/dL

## 2021-02-09 LAB — FERRITIN: Ferritin: 14 ng/mL (ref 11–307)

## 2021-02-09 NOTE — Telephone Encounter (Signed)
Placed orders for h pylor IGG

## 2021-02-09 NOTE — Telephone Encounter (Signed)
Patient verbalized understanding of results. She states she will try to get lab work done today

## 2021-02-09 NOTE — Telephone Encounter (Signed)
-----   Message from Toney Reil, MD sent at 02/08/2021  2:00 PM EDT ----- Recommend H. pylori IgG and treat if positive.  Gastric biopsy results revealed acute inflammation in her stomach.  Continue omeprazole 20 mg twice daily for now  RV

## 2021-02-10 ENCOUNTER — Encounter: Payer: Self-pay | Admitting: Obstetrics

## 2021-02-10 ENCOUNTER — Ambulatory Visit (INDEPENDENT_AMBULATORY_CARE_PROVIDER_SITE_OTHER): Payer: PRIVATE HEALTH INSURANCE | Admitting: Obstetrics

## 2021-02-10 VITALS — BP 128/80 | Ht 63.0 in | Wt 187.0 lb

## 2021-02-10 DIAGNOSIS — N898 Other specified noninflammatory disorders of vagina: Secondary | ICD-10-CM | POA: Diagnosis not present

## 2021-02-10 DIAGNOSIS — Z113 Encounter for screening for infections with a predominantly sexual mode of transmission: Secondary | ICD-10-CM | POA: Diagnosis not present

## 2021-02-10 NOTE — Progress Notes (Signed)
Obstetrics & Gynecology Office Visit   Chief Complaint:  Chief Complaint  Patient presents with  . Vaginal Irritation    Happened right after ovulation time    History of Present Illness: Tina Simon presents with a c/o some perineal irritation and a vaginal discharge that started right after midcycle. She is not sexually active at the moment. She was seen in March and prescribed some OCPs , which she admits she has not started.she wants to see if her periods will become regular off of the hormones. She denies any itching today, but shares that she ahs had BV in the past and wonders if this is the diagnosis   Review of Systems:  Review of Systems  Constitutional: Negative.   HENT: Negative.   Eyes: Negative.   Respiratory: Negative.   Cardiovascular: Negative.   Gastrointestinal: Negative.   Genitourinary: Negative.   Musculoskeletal: Negative.   Skin: Positive for rash.       Some perineal redness   Neurological: Negative.   Endo/Heme/Allergies: Negative.   Psychiatric/Behavioral: Negative.      Past Medical History:  Past Medical History:  Diagnosis Date  . Anemia   . Diabetes mellitus without complication (HCC)    pre diabetes  . Environmental allergies   . GERD (gastroesophageal reflux disease)   . HPV in female   . IDA (iron deficiency anemia) 09/22/2020    Past Surgical History:  Past Surgical History:  Procedure Laterality Date  . BARTHOLIN CYST MARSUPIALIZATION    . ESOPHAGOGASTRODUODENOSCOPY (EGD) WITH PROPOFOL N/A 02/05/2021   Procedure: ESOPHAGOGASTRODUODENOSCOPY (EGD) WITH PROPOFOL;  Surgeon: Toney Reil, MD;  Location: [facility] ENDOSCOPY;  Service: Gastroenterology;  Laterality: N/A;    Gynecologic History: Patient's last menstrual period was 01/24/2021 (exact date).  Obstetric History: G0P0000  Family History:  Family History  Problem Relation Age of Onset  . Diabetes Mother   . Hypertension Mother   . Hypertension Father   . Diabetes Father    . Breast cancer Neg Hx   . Ovarian cancer Neg Hx   . Colon cancer Neg Hx     Social History:  Social History   Socioeconomic History  . Marital status: Single    Spouse name: Not on file  . Number of children: Not on file  . Years of education: Not on file  . Highest education level: Not on file  Occupational History  . Not on file  Tobacco Use  . Smoking status: Never Smoker  . Smokeless tobacco: Never Used  Vaping Use  . Vaping Use: Never used  Substance and Sexual Activity  . Alcohol use: No  . Drug use: No  . Sexual activity: Not Currently    Birth control/protection: None  Other Topics Concern  . Not on file  Social History Narrative  . Not on file   Social Determinants of Health   Financial Resource Strain: Not on file  Food Insecurity: Not on file  Transportation Needs: Not on file  Physical Activity: Not on file  Stress: Not on file  Social Connections: Not on file  Intimate Partner Violence: Not on file    Allergies:  No Known Allergies  Medications: Prior to Admission medications   Medication Sig Start Date End Date Taking? Authorizing Provider  cetirizine (ZYRTEC) 10 MG tablet Take 10 mg by mouth daily. 08/13/20  Yes [provider]  Cyanocobalamin 1000 MCG LOZG Place under the tongue.   Yes [provider]  fluticasone (FLONASE) 50 MCG/ACT nasal  spray SPRAY 2 SPRAYS INTO EACH NOSTRIL EVERY DAY 12/25/20  Yes [provider]  metFORMIN (GLUCOPHAGE-XR) 500 MG 24 hr tablet Take by mouth. 06/11/20 06/11/21 Yes [provider]  Multiple Vitamins-Minerals (MULTIVITAMIN ADULT EXTRA C PO) Take by mouth.   Yes [provider]  omeprazole (PRILOSEC) 20 MG capsule Take 1 capsule (20 mg total) by mouth 2 (two) times daily before a meal. 01/28/21 02/27/21 Yes Vanga, Loel Dubonnet, MD  Probiotic Product (PROBIOTIC-10 PO) Take by mouth.   Yes [provider]  Norethindrone Acetate-Ethinyl Estrad-FE (MICROGESTIN 24  FE) 1-20 MG-MCG(24) tablet Take 1 tablet by mouth daily. Patient not taking: Reported on 02/10/2021 01/08/21   Copland, Helmut Muster B, PA-C  ferrous sulfate 325 (65 FE) MG tablet Take 325 mg by mouth daily. 08/18/20 12/18/20  [provider]  triamcinolone (NASACORT) 55 MCG/ACT AERO nasal inhaler Place 2 sprays into the nose daily.  12/18/20  [provider]    Physical Exam Vitals:  Vitals:   02/10/21 0922  BP: 128/80   Patient's last menstrual period was 01/24/2021 (exact date).  Physical Exam Constitutional:      Appearance: Normal appearance. She is obese.  HENT:     Head: Normocephalic and atraumatic.  Cardiovascular:     Rate and Rhythm: Normal rate and regular rhythm.     Pulses: Normal pulses.     Heart sounds: Normal heart sounds.  Pulmonary:     Effort: Pulmonary effort is normal.     Breath sounds: Normal breath sounds.  Abdominal:     General: Bowel sounds are normal.     Palpations: Abdomen is soft.  Genitourinary:    General: Normal vulva.     Vagina: Vaginal discharge present.     Rectum: Normal.     Comments: Normal hair distribution. No lesions noted Some reddened area along her right groin are (likely yeast. Scant vaginal discharge that is without odor. No CMT and non-enlarged uterus. Musculoskeletal:     Cervical back: Normal range of motion and neck supple.  Skin:    General: Skin is warm and dry.  Neurological:     General: No focal deficit present.     Mental Status: She is alert and oriented to person, place, and time.  Psychiatric:        Mood and Affect: Mood normal.        Behavior: Behavior normal.      Assessment: 35 y.o. G0P0000 with vaginal discharge and irritation.   Plan: Problem List Items Addressed This Visit   None   Visit Diagnoses    Vaginal irritation    -  Primary   Relevant Orders   NuSwab Vaginitis Plus (VG+)   Vaginal discharge       Relevant Orders   NuSwab Vaginitis Plus (VG+)   Routine screening  for STI (sexually transmitted infection)       Relevant Orders   NuSwab Vaginitis Plus (VG+)     Will contact her with the results of her Nuswab. Suggested she try some Monistat along her inner thighs at area of irritation. F/U PRN.  Mirna Mires, CNM  02/10/2021 6:00 PM

## 2021-02-13 LAB — NUSWAB VAGINITIS PLUS (VG+)
Atopobium vaginae: HIGH Score — AB
Candida albicans, NAA: NEGATIVE
Candida glabrata, NAA: NEGATIVE
Chlamydia trachomatis, NAA: NEGATIVE
Neisseria gonorrhoeae, NAA: NEGATIVE
Trich vag by NAA: NEGATIVE

## 2021-02-16 ENCOUNTER — Telehealth: Payer: Self-pay

## 2021-02-16 ENCOUNTER — Other Ambulatory Visit
Admission: RE | Admit: 2021-02-16 | Discharge: 2021-02-16 | Disposition: A | Discharge status: Home / Self Care | Payer: PRIVATE HEALTH INSURANCE | Attending: Gastroenterology | Admitting: Gastroenterology

## 2021-02-16 ENCOUNTER — Other Ambulatory Visit: Payer: Self-pay | Admitting: Obstetrics

## 2021-02-16 DIAGNOSIS — B9689 Other specified bacterial agents as the cause of diseases classified elsewhere: Secondary | ICD-10-CM

## 2021-02-16 DIAGNOSIS — N898 Other specified noninflammatory disorders of vagina: Secondary | ICD-10-CM

## 2021-02-16 DIAGNOSIS — N760 Acute vaginitis: Secondary | ICD-10-CM | POA: Diagnosis not present

## 2021-02-16 MED ORDER — METRONIDAZOLE 500 MG PO TABS: 500.0000 mg | ORAL_TABLET | Freq: Two times a day (BID) | ORAL | 0 refills | Status: AC

## 2021-02-16 NOTE — Progress Notes (Signed)
Patient seen and treated for presumptive yeast vaginitis . Her Nuswab indicates some BV, and as she was symptomatic, will prescribe her some Metronidazole. She has been notified by phone and agrees to the plan Mirna Mires, CNM  02/16/2021 9:55 AM   .

## 2021-02-16 NOTE — Telephone Encounter (Signed)
When patient went for lab work the lab work that Dr. Allegra Lai order was not done. Patient was very upset that this was not done because she states she does not have the money to keep going to get lab work done. She state she will go back again but they better draw the right lab. Informed patient it is the H pylori lab work and she states she will let them know

## 2021-02-16 NOTE — Telephone Encounter (Signed)
Patient left vmail asking if her lab results are available?

## 2021-02-18 LAB — MISC LABCORP TEST (SEND OUT): Labcorp test code: 162289

## 2021-02-23 ENCOUNTER — Telehealth: Payer: Self-pay

## 2021-02-23 MED ORDER — OMEPRAZOLE 20 MG PO CPDR: 20.0000 mg | DELAYED_RELEASE_CAPSULE | Freq: Two times a day (BID) | ORAL | 2 refills | Status: DC

## 2021-02-23 NOTE — Telephone Encounter (Signed)
H. pylori blood test came back normal.  She can continue omeprazole 20 mg 1-2 times daily for now if she has ongoing epigastric pain  I will see her for follow up  RV

## 2021-02-23 NOTE — Telephone Encounter (Signed)
Patient called to receive the lab results from last Monday. Would like to speak to the nurse.

## 2021-02-23 NOTE — Telephone Encounter (Signed)
Please advised about lab work that was done on 02/17/2020

## 2021-02-23 NOTE — Addendum Note (Signed)
Addended by: Radene Knee L on: 02/23/2021 04:59 PM   Modules accepted: Orders

## 2021-02-23 NOTE — Telephone Encounter (Signed)
Patient verbalized understanding and she states she needs a refill on omeprazole so refilled it to the pharmacy

## 2021-04-08 ENCOUNTER — Ambulatory Visit: Payer: PRIVATE HEALTH INSURANCE | Admitting: Gastroenterology

## 2021-05-19 IMAGING — CR DG NECK SOFT TISSUE
2 series · 2 of 2 positions shown · non-contrast
Comparison: No prior.

CLINICAL DATA: Sore throat.  Painful swallowing.

EXAM:
NECK SOFT TISSUES - 1+ VIEW

[neck lat]
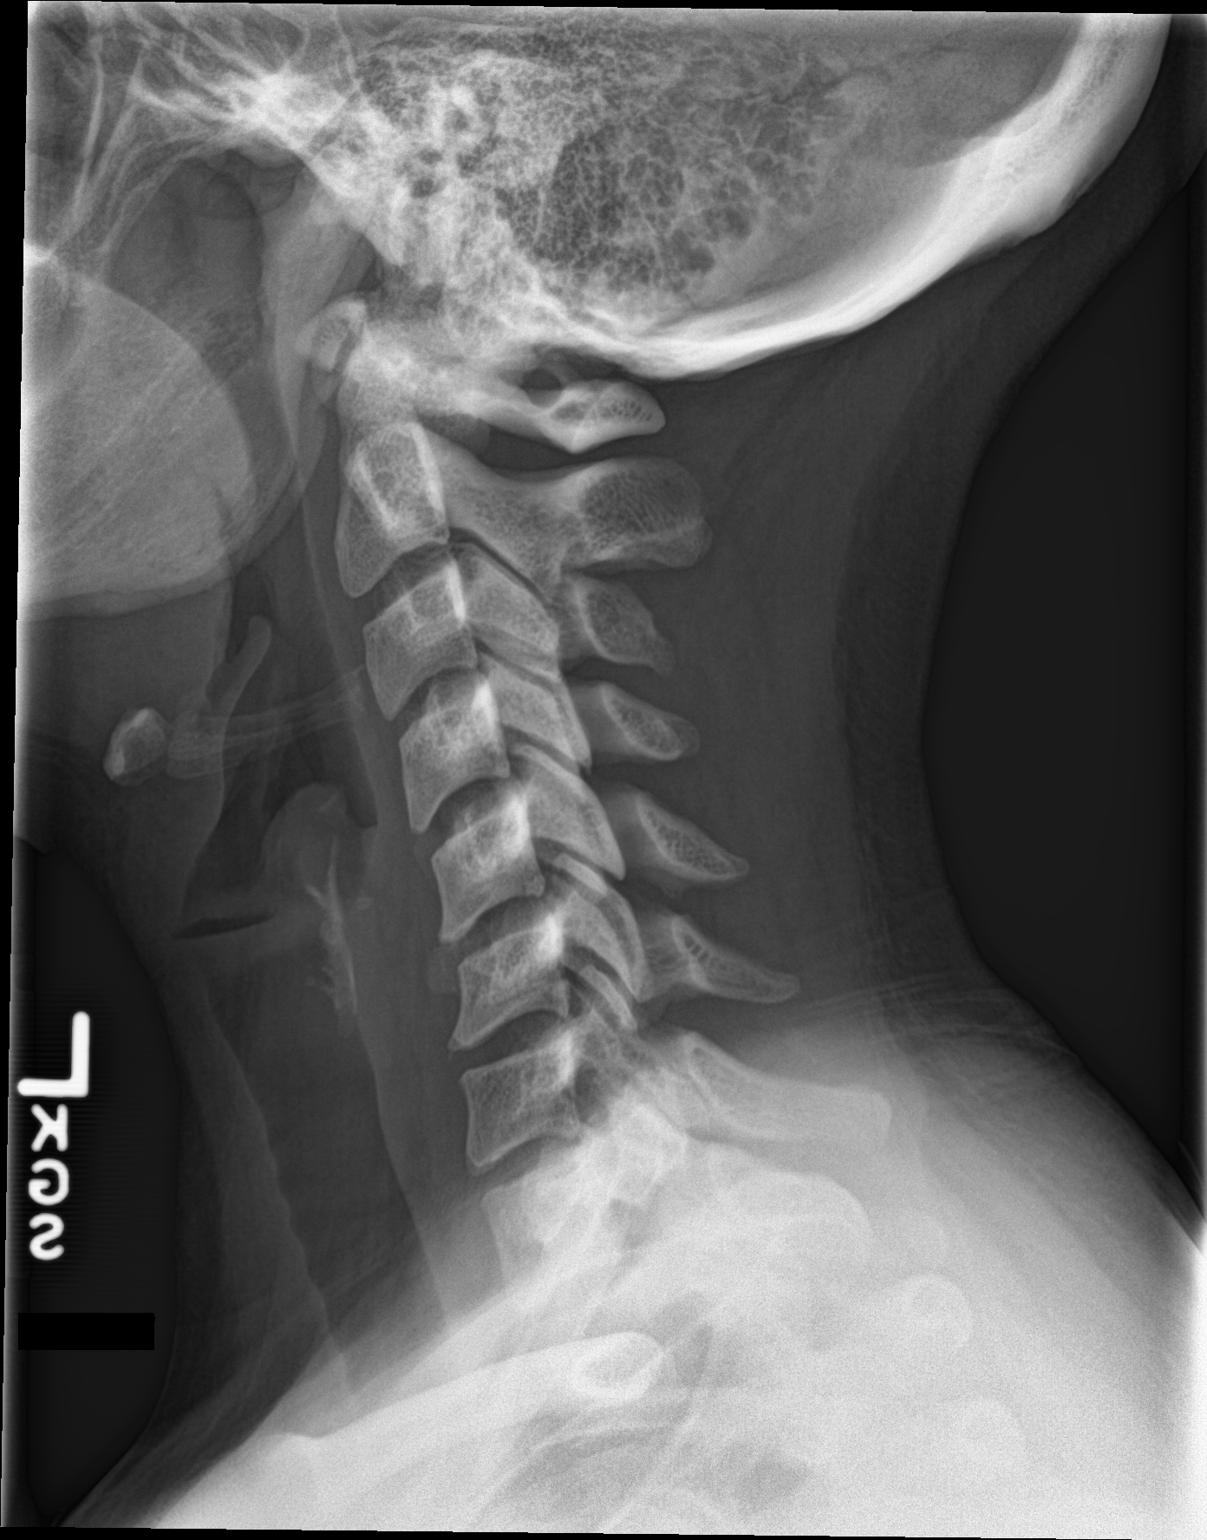

[neck ap]
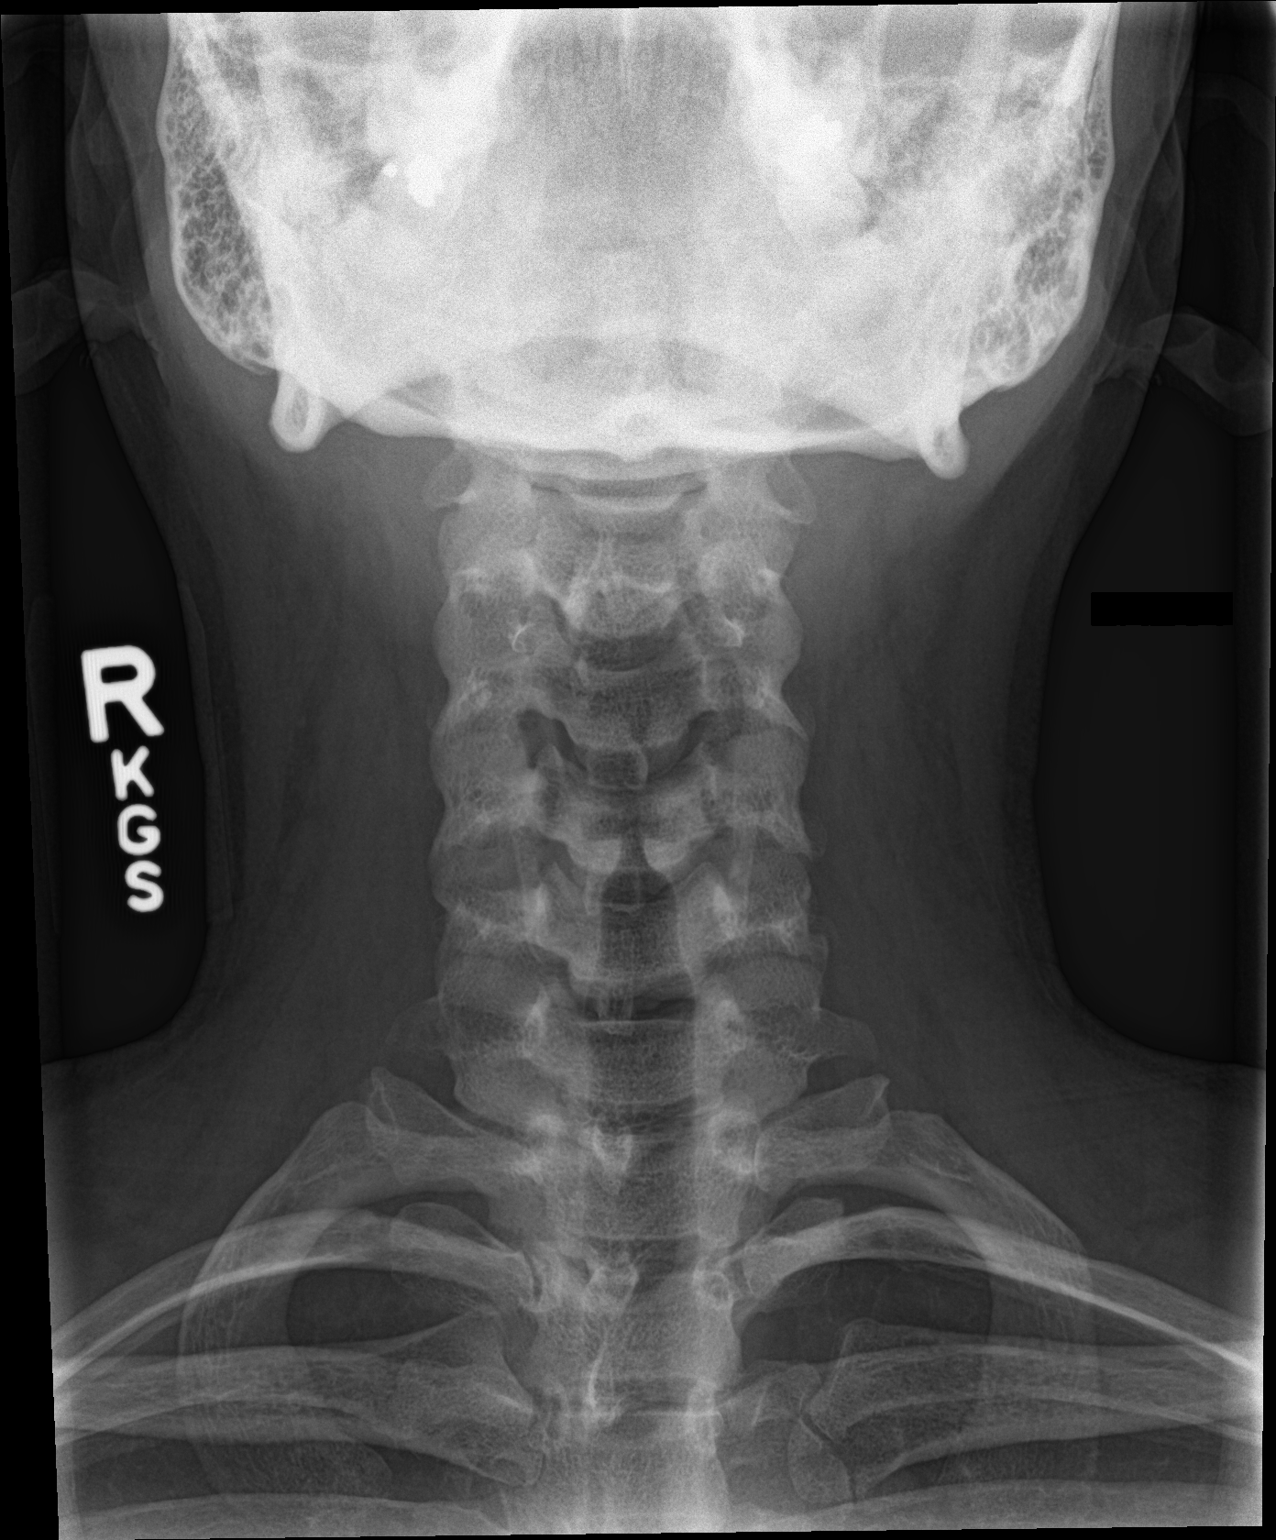

[2 of 2 positions shown; findings below may reference images not displayed]

FINDINGS: Loss of normal cervical lordosis. No acute bony abnormality
identified. Adenoids and tonsils appear unremarkable. Epiglottis and
retropharyngeal space appears unremarkable. No acute bony
abnormality. Subglottic tracheal narrowing noted on AP view only.
This is most likely related to phase of respiration/swallowing,
clinical correlation suggested. Trachea appears widely patent on
lateral view. Pulmonary apices are clear.
IMPRESSION: Loss of normal cervical lordosis. No acute bony abnormality
identified. No acute soft tissue abnormality identified. If symptoms
persists contrast-enhanced neck CT can be obtained.

## 2021-05-28 ENCOUNTER — Telehealth: Payer: PRIVATE HEALTH INSURANCE

## 2021-05-28 ENCOUNTER — Telehealth: Payer: PRIVATE HEALTH INSURANCE | Admitting: Physician Assistant

## 2021-05-28 ENCOUNTER — Encounter: Payer: Self-pay | Admitting: Physician Assistant

## 2021-05-28 DIAGNOSIS — N898 Other specified noninflammatory disorders of vagina: Secondary | ICD-10-CM | POA: Diagnosis not present

## 2021-05-28 MED ORDER — METRONIDAZOLE 500 MG PO TABS: 500.0000 mg | ORAL_TABLET | Freq: Two times a day (BID) | ORAL | 0 refills | Status: AC

## 2021-05-28 NOTE — Patient Instructions (Signed)
Tina Simon, thank you for joining Karrie Meres, PA-C for today's virtual visit.  While this provider is not your primary care provider (PCP), if your PCP is located in our provider database this encounter information will be shared with them immediately following your visit.  Consent: (Patient) Tina Simon provided verbal consent for this virtual visit at the beginning of the encounter.  Current Medications:  Current Outpatient Medications:    metroNIDAZOLE (FLAGYL) 500 MG tablet, Take 1 tablet (500 mg total) by mouth 2 (two) times daily for 7 days., Disp: 14 tablet, Rfl: 0   cetirizine (ZYRTEC) 10 MG tablet, Take 10 mg by mouth daily., Disp: , Rfl:    Cyanocobalamin 1000 MCG LOZG, Place under the tongue., Disp: , Rfl:    fluticasone (FLONASE) 50 MCG/ACT nasal spray, SPRAY 2 SPRAYS INTO EACH NOSTRIL EVERY DAY, Disp: , Rfl:    metFORMIN (GLUCOPHAGE-XR) 500 MG 24 hr tablet, Take by mouth., Disp: , Rfl:    Multiple Vitamins-Minerals (MULTIVITAMIN ADULT EXTRA C PO), Take by mouth., Disp: , Rfl:    Norethindrone Acetate-Ethinyl Estrad-FE (MICROGESTIN 24 FE) 1-20 MG-MCG(24) tablet, Take 1 tablet by mouth daily. (Patient not taking: Reported on 02/10/2021), Disp: 84 tablet, Rfl: 3   omeprazole (PRILOSEC) 20 MG capsule, Take 1 capsule (20 mg total) by mouth 2 (two) times daily before a meal., Disp: 60 capsule, Rfl: 2   Probiotic Product (PROBIOTIC-10 PO), Take by mouth., Disp: , Rfl:    Medications ordered in this encounter:  Meds ordered this encounter  Medications   metroNIDAZOLE (FLAGYL) 500 MG tablet    Sig: Take 1 tablet (500 mg total) by mouth 2 (two) times daily for 7 days.    Dispense:  14 tablet    Refill:  0    Order Specific Question:   Supervising Provider    Answer:   Hyacinth Meeker, BRIAN [[id number]]     *If you need refills on other medications prior to your next appointment, please contact your pharmacy*  Follow-Up: Call back or seek an in-person evaluation if the symptoms  worsen or if the condition fails to improve as anticipated.  Other Instructions Take flagyl as directed. Do not drunk alcohol while taking flagyl. follow up with your obgyn within the next month   If you have been instructed to have an in-person evaluation today at a local Urgent Care facility, please use the link below. It will take you to a list of all of our available [facility], including address, phone number and hours of operation. Please do not delay care.  [facility]  If you or a family member do not have a primary care provider, use the link below to schedule a visit and establish care. When you choose a [facility] primary care physician or advanced practice provider, you gain a long-term partner in health. Find a Primary Care Provider  Learn more about [facility]'s in-office and virtual care options: [facility] - Get Care Now Bacterial Vaginosis  Bacterial vaginosis is an infection that occurs when the normal balance of bacteria in the vagina changes. This change is caused by an overgrowth of certain bacteria in the vagina. Bacterial vaginosis is the most common vaginalinfection among females aged 6 to 48 years. This condition increases the risk of sexually transmitted infections (STIs). Treatment can help reduce this risk. Treatment is very important for pregnant women because this condition can cause babies to be born early (prematurely) or at a low  birth weight. What are the causes? This condition is caused by an increase in harmful bacteria that are normally present in small amounts in the vagina. However, the exact reason thiscondition develops is not known. You cannot get bacterial vaginosis from toilet seats, bedding, swimming pools,or contact with objects around you. What increases the risk? The following factors may make you more likely to develop this condition: Having a new sexual partner or multiple sexual partners, or having unprotected  sex. Douching. Having an intrauterine device (IUD). Smoking. Abusing drugs and alcohol. This may lead to riskier sexual behavior. Taking certain antibiotic medicines. Being pregnant. What are the signs or symptoms? Some women with this condition have no symptoms. Symptoms may include: Wallace Cullens or white vaginal discharge. The discharge can be watery or foamy. A fish-like odor with discharge, especially after sex or during menstruation. Itching in and around the vagina. Burning or pain with urination. How is this diagnosed? This condition is diagnosed based on: Your medical history. A physical exam of the vagina. Checking a sample of vaginal fluid for harmful bacteria or abnormal cells. How is this treated? This condition is treated with antibiotic medicines. These may be given as a pill, a vaginal cream, or a medicine that is put into the vagina (suppository). If the condition comes back after treatment, a second round of antibioticsmay be needed. Follow these instructions at home: Medicines Take or apply over-the-counter and prescription medicines only as told by your health care provider. Take or apply your antibiotic medicine as told by your health care provider. Do not stop using the antibiotic even if you start to feel better. General instructions If you have a female sexual partner, tell her that you have a vaginal infection. She should follow up with her health care provider. If you have a female sexual partner, he does not need treatment. Avoid sexual activity until you finish treatment. Drink enough fluid to keep your urine pale yellow. Keep the area around your vagina and rectum clean. Wash the area daily with warm water. Wipe yourself from front to back after using the toilet. If you are breastfeeding, talk to your health care provider about continuing breastfeeding during treatment. Keep all follow-up visits. This is important. How is this prevented? Self-care Do not  douche. Wash the outside of your vagina with warm water only. Wear cotton or cotton-lined underwear. Avoid wearing tight pants and pantyhose, especially during the summer. Safe sex Use protection when having sex. This includes: Using condoms. Using dental dams. This is a thin layer of a material made of latex or polyurethane that protects the mouth during oral sex. Limit the number of sexual partners. To help prevent bacterial vaginosis, it is best to have sex with just one partner (monogamous relationship). Make sure you and your sexual partner are tested for STIs. Drugs and alcohol Do not use any products that contain nicotine or tobacco. These products include cigarettes, chewing tobacco, and vaping devices, such as e-cigarettes. If you need help quitting, ask your health care provider. Do not use drugs. Do not drink alcohol if: Your health care provider tells you not to do this. You are pregnant, may be pregnant, or are planning to become pregnant. If you drink alcohol: Limit how much you have to 0-1 drink a day. Be aware of how much alcohol is in your drink. In the [place] one drink equals one 12 oz bottle of beer (355 mL), one 5 oz glass of wine (148 mL), or one 1 oz glass  of hard liquor (44 mL). Where to find more information Centers for Disease Control and Prevention: FootballExhibition.com.br American Sexual Health Association (ASHA): www.ashastd.org U.S. Department of Health and Health and safety inspector, Office on Women's Health: http://hoffman.com/ Contact a health care provider if: Your symptoms do not improve, even after treatment. You have more discharge or pain when urinating. You have a fever or chills. You have pain in your abdomen or pelvis. You have pain during sex. You have vaginal bleeding between menstrual periods. Summary Bacterial vaginosis is a vaginal infection that occurs when the normal balance of bacteria in the vagina changes. It results from an overgrowth of certain  bacteria. This condition increases the risk of sexually transmitted infections (STIs). Getting treated can help reduce this risk. Treatment is very important for pregnant women because this condition can cause babies to be born early (prematurely) or at low birth weight. This condition is treated with antibiotic medicines. These may be given as a pill, a vaginal cream, or a medicine that is put into the vagina (suppository). This information is not intended to replace advice given to you by your health care provider. Make sure you discuss any questions you have with your healthcare provider. Document Revised: 04/17/2020 Document Reviewed: 04/17/2020 Elsevier Patient Education  2022 [employer].

## 2021-05-28 NOTE — Progress Notes (Addendum)
Tina Simon, Tina Simon are scheduled for a virtual visit with your provider today.    Just as we do with appointments in the office, we must obtain your consent to participate.  Your consent will be active for this visit and any virtual visit you may have with one of our providers in the next 365 days.    If you have a MyChart account, I can also send a copy of this consent to you electronically.  All virtual visits are billed to your insurance company just like a traditional visit in the office.  As this is a virtual visit, video technology does not allow for your provider to perform a traditional examination.  This may limit your provider's ability to fully assess your condition.  If your provider identifies any concerns that need to be evaluated in person or the need to arrange testing such as labs, EKG, etc, we will make arrangements to do so.    Although advances in technology are sophisticated, we cannot ensure that it will always work on either your end or our end.  If the connection with a video visit is poor, we may have to switch to a telephone visit.  With either a video or telephone visit, we are not always able to ensure that we have a secure connection.   I need to obtain your verbal consent now.   Are you willing to proceed with your visit today?   Tina Simon has provided verbal consent on 05/28/2021 for a virtual visit (video or telephone).   Midas Daughety Manfred Shirts, PA-C 05/28/2021  7:25 PM  Ms. Tina, Simon are scheduled for a virtual visit with your provider today.    Just as we do with appointments in the office, we must obtain your consent to participate.  Your consent will be active for this visit and any virtual visit you may have with one of our providers in the next 365 days.    If you have a MyChart account, I can also send a copy of this consent to you electronically.  All virtual visits are billed to your insurance company just like a traditional visit in the office.  As this is a  virtual visit, video technology does not allow for your provider to perform a traditional examination.  This may limit your provider's ability to fully assess your condition.  If your provider identifies any concerns that need to be evaluated in person or the need to arrange testing such as labs, EKG, etc, we will make arrangements to do so.    Although advances in technology are sophisticated, we cannot ensure that it will always work on either your end or our end.  If the connection with a video visit is poor, we may have to switch to a telephone visit.  With either a video or telephone visit, we are not always able to ensure that we have a secure connection.   I need to obtain your verbal consent now.   Are you willing to proceed with your visit today?   [name] has provided verbal consent on 05/28/2021 for a virtual visit (video or telephone).   Karrie Meres, PA-C 05/28/2021  7:25 PM   Date:  05/28/2021   ID:  Tina Simon November 12, 1985, MRN [id number]  Patient Location: Home Provider Location: Home Office   Participants: Patient and Provider for Visit and Wrap up  Method of visit: Video  Location of Patient: Home Location of Provider: Home Office Consent was  obtain for visit over the video. Services rendered by provider: Visit was performed via video  A video enabled telemedicine application was used and I verified that I am speaking with the correct person using two identifiers.  PCP:  Nira Retort   Chief Complaint:  vaginal discharge  History of Present Illness:    Tina Simon is a 35 y.o. female with history as stated below. 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  She denies any vaginal irritation or burning. She is currently sexually active with one partner and does not have concern for STD. Denies pelvic pain or fevers.   This typically  occurs around her menstrual cycle. Has followed with obgyn in the past for this.  Onset of symptoms was yesterday and symptoms have been persistent and include:        Past Medical, Surgical, Social History, Allergies, and Medications have been Reviewed.  Past Medical History:  Diagnosis Date   Anemia    Diabetes mellitus without complication (HCC)    pre diabetes   Environmental allergies    GERD (gastroesophageal reflux disease)    HPV in female    IDA (iron deficiency anemia) 09/22/2020    Current Meds  Medication Sig   metroNIDAZOLE (FLAGYL) 500 MG tablet Take 1 tablet (500 mg total) by mouth 2 (two) times daily for 7 days.     Allergies:   Patient has no known allergies.   ROS See HPI for history of present illness.  Physical Exam Constitutional:      General: She is not in acute distress. Pulmonary:     Effort: Pulmonary effort is normal.  Neurological:     Mental Status: She is alert.         MDM: Pt with vaginal discharge with hx recurrent bv. Ran out of her medication for bv. Is around her menses which is when sxs typically flair. Sxs similar to prior episodes. Denies concern for std. No pelvic pain to suggest pid. Will tx with flagyl.    Advised f/u with obgyn  There are no diagnoses linked to this encounter.   Time:   Today, I have spent 15 minutes with the patient with telehealth technology discussing the above problems, reviewing the chart, previous notes, medications and orders.    Tests Ordered: No orders of the defined types were placed in this encounter.   Medication Changes: Meds ordered this encounter  Medications   metroNIDAZOLE (FLAGYL) 500 MG tablet    Sig: Take 1 tablet (500 mg total) by mouth 2 (two) times daily for 7 days.    Dispense:  14 tablet    Refill:  0    Order Specific Question:   Supervising Provider    Answer:   Eber Hong [[id number]]     Disposition:  Follow up  Signed, Deondria Puryear Manfred Shirts, PA-C  05/28/2021 7:25 PM

## 2021-06-08 ENCOUNTER — Other Ambulatory Visit: Payer: Self-pay | Admitting: Gastroenterology

## 2021-06-10 IMAGING — CR DG ABDOMEN 1V
1 series · 1 of 1 positions shown · non-contrast
Comparison: None.

CLINICAL DATA: Left-sided abdominal pain with suspected
constipation

EXAM:
ABDOMEN - 1 VIEW

[dg abd 1 view]
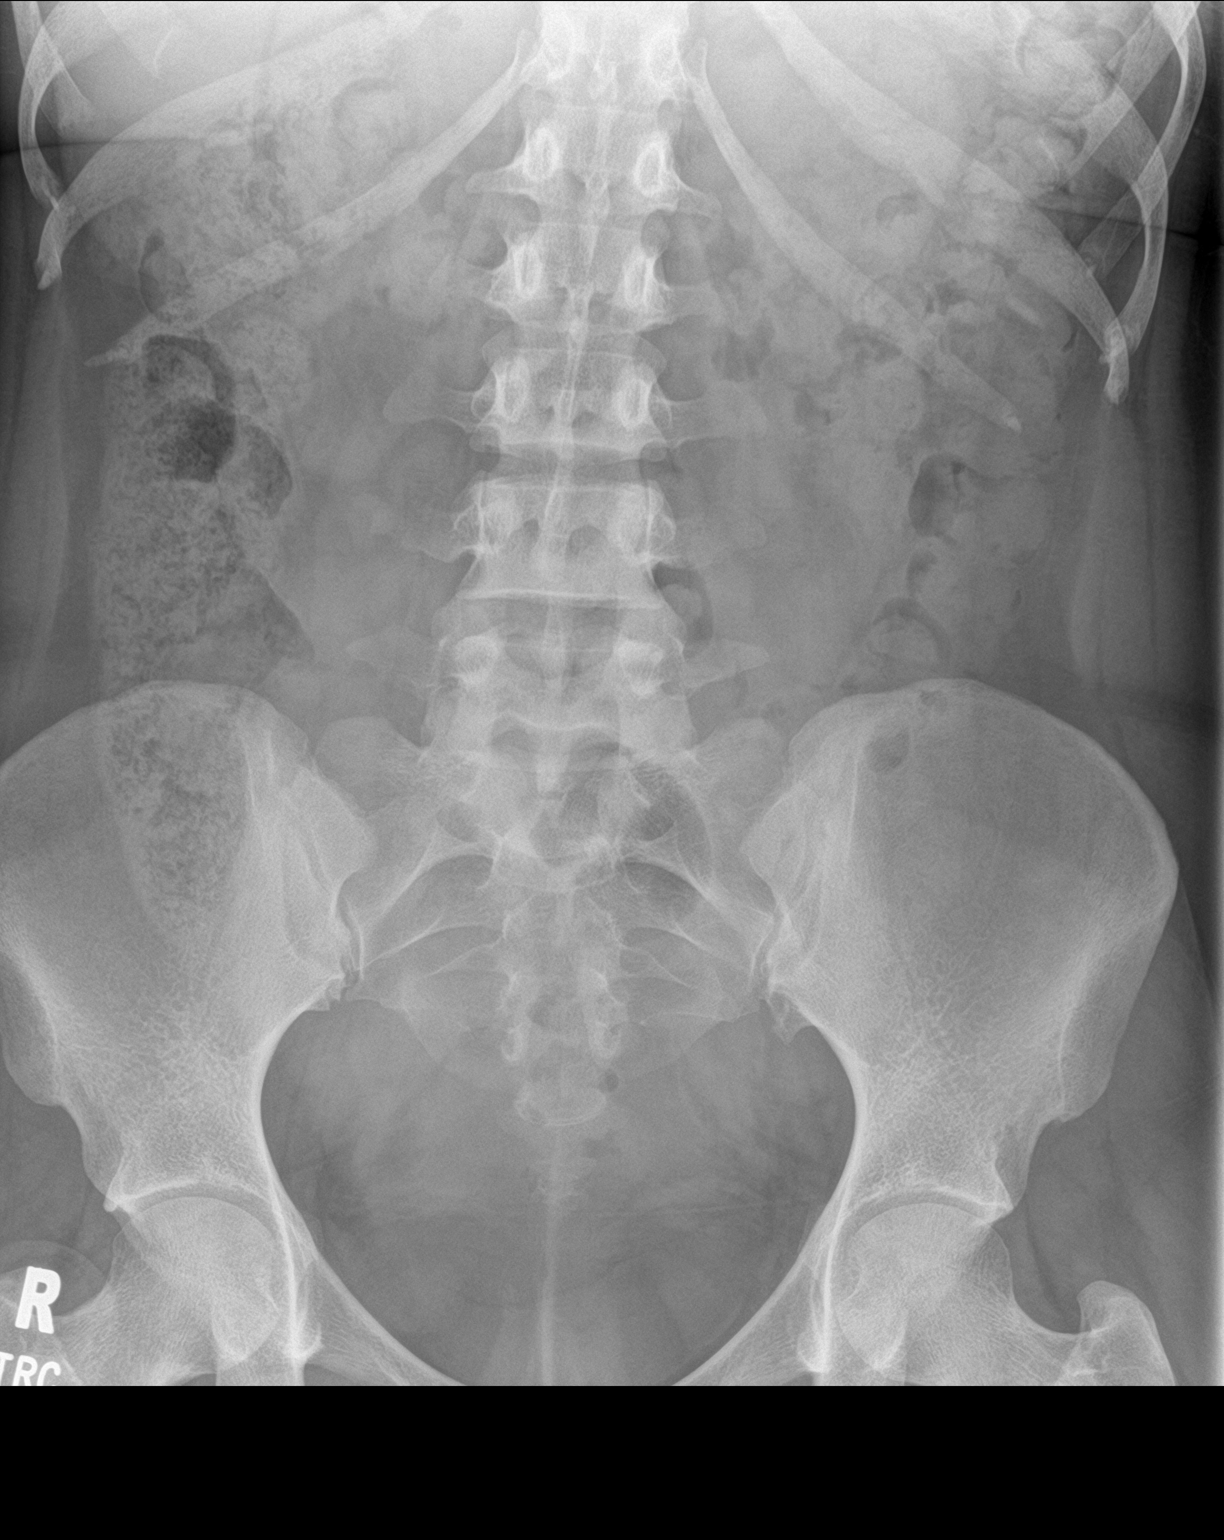

[1 of 1 positions shown; findings below may reference images not displayed]

FINDINGS: Formed stool seen in the ascending, transverse, and descending
colon. No rectal impaction or small bowel obstruction. No concerning
mass effect or gas collection.
IMPRESSION: Moderate colonic stool retention.  No impaction or obstruction.

## 2021-06-11 ENCOUNTER — Other Ambulatory Visit: Payer: Self-pay | Admitting: Gastroenterology

## 2021-06-22 ENCOUNTER — Other Ambulatory Visit: Payer: Self-pay

## 2021-06-24 ENCOUNTER — Encounter: Payer: Self-pay | Admitting: Gastroenterology

## 2021-06-24 ENCOUNTER — Other Ambulatory Visit: Payer: Self-pay

## 2021-06-24 ENCOUNTER — Ambulatory Visit (INDEPENDENT_AMBULATORY_CARE_PROVIDER_SITE_OTHER): Payer: PRIVATE HEALTH INSURANCE | Admitting: Gastroenterology

## 2021-06-24 VITALS — BP 132/88 | HR 101 | Temp 100.0°F | Ht 63.0 in | Wt 178.2 lb

## 2021-06-24 DIAGNOSIS — K219 Gastro-esophageal reflux disease without esophagitis: Secondary | ICD-10-CM

## 2021-06-24 MED ORDER — OMEPRAZOLE 20 MG PO CPDR: 20.0000 mg | DELAYED_RELEASE_CAPSULE | Freq: Two times a day (BID) | ORAL | 2 refills | Status: DC

## 2021-06-24 NOTE — Progress Notes (Signed)
Arlyss Repress, MD [address]  [address]  [place], [place] [address]  Main: [phone]  Fax: [phone]    Gastroenterology Consultation  Referring Provider:     Nira Retort Primary Care Physician:  Nira Retort Primary Gastroenterologist:  Dr. Arlyss Repress Reason for Consultation:   Globus sensation, GERD, constipation        HPI:   Tina Simon is a 35 y.o. female referred by Dr. Nira Retort  for consultation & management of dysphagia.  Patient reports that approximately 2 weeks ago she was having severe sinusitis for which she was treated.  Since then, she has been noticing difficulty swallowing and pain in her throat.  After she was treated for sinusitis, throat discomfort has resolved.  However, she continues to have sensation of food stuck in her throat.  She does have postnasal drip as well.  She started on Protonix 20 mg daily which provides some relief.  She would like to discuss about upper endoscopy today.  Patient denies heartburn, regurgitation, epigastric pain, nausea, vomiting, weight loss.  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  She sees Dr. Cathie Hoops, receiving parenteral iron with resolution of anemia  Follow-up visit 06/24/2021 Tina Simon is doing very well today.  She reports that her GI symptoms have resolved.  She is currently taking omeprazole 20 mg 1-2 times daily.  She lost about 10 pounds by following healthy diet.  She also reports that her constipation has resolved.  Patient does not smoke or drink alcohol  NSAIDs: Ibuprofen as needed  Antiplts/Anticoagulants/Anti thrombotics: None  GI Procedures:  Upper endoscopy 02/05/2021 - Normal duodenal bulb and second portion of the duodenum. - Erythematous mucosa in the antrum. Biopsied. - Erosive gastropathy with no bleeding and no stigmata of recent bleeding. - Normal gastric body and incisura. Biopsied. - Esophagogastric landmarks  identified. - Normal gastroesophageal junction and esophagus. Biopsied.  DIAGNOSIS:  A.  STOMACH; RANDOM COLD BIOPSY:  - ANTRAL MUCOSA WITH FOCAL ACUTE GASTRITIS AND POSSIBLE FOCAL EROSION,  SEE COMMENT.  - OXYNTIC MUCOSA WITHOUT SPECIFIC PATHOLOGIC CHANGES.  - NEGATIVE FOR H. PYLORI, INTESTINAL METAPLASIA, DYSPLASIA, AND  MALIGNANCY.   Comment:  Regarding the gastric biopsies (A), no viral cytopathic effects are  identified. Medication effect is a consideration, especially NSAIDS.   B.  ESOPHAGUS; RANDOM COLD BIOPSY:  - STRATIFIED SQUAMOUS EPITHELIUM WITHOUT EOSINOPHILS, NEUTROPHILS, OR  REACTIVE CHANGES.  - NEGATIVE FOR DYSPLASIA AND MALIGNANCY.  She denies family history of GI malignancy  Past Medical History:  Diagnosis Date   Anemia    Diabetes mellitus without complication (HCC)    pre diabetes   Environmental allergies    GERD (gastroesophageal reflux disease)    HPV in female    IDA (iron deficiency anemia) 09/22/2020    Past Surgical History:  Procedure Laterality Date   BARTHOLIN CYST MARSUPIALIZATION     ESOPHAGOGASTRODUODENOSCOPY (EGD) WITH PROPOFOL N/A 02/05/2021   Procedure: ESOPHAGOGASTRODUODENOSCOPY (EGD) WITH PROPOFOL;  Surgeon: Toney Reil, MD;  Location: [facility] ENDOSCOPY;  Service: Gastroenterology;  Laterality: N/A;    Current Outpatient Medications:    cetirizine (ZYRTEC) 10 MG tablet, Take 10 mg by mouth daily., Disp: , Rfl:    Cyanocobalamin 1000 MCG LOZG, Place under the tongue., Disp: , Rfl:    fluticasone (FLONASE) 50 MCG/ACT nasal spray, SPRAY 2 SPRAYS INTO EACH NOSTRIL EVERY DAY, Disp: , Rfl:    Multiple Vitamins-Minerals (MULTIVITAMIN ADULT EXTRA C PO), Take by mouth.,  Disp: , Rfl:    Probiotic Product (PROBIOTIC-10 PO), Take by mouth., Disp: , Rfl:    metFORMIN (GLUCOPHAGE-XR) 500 MG 24 hr tablet, Take by mouth., Disp: , Rfl:    montelukast (SINGULAIR) 10 MG tablet, SMARTSIG:1 Tablet(s) By Mouth Every Evening (Patient not taking:  Reported on 06/24/2021), Disp: , Rfl:    omeprazole (PRILOSEC) 20 MG capsule, Take 1 capsule (20 mg total) by mouth 2 (two) times daily before a meal., Disp: 60 capsule, Rfl: 2   Family History  Problem Relation Age of Onset   Diabetes Mother    Hypertension Mother    Hypertension Father    Diabetes Father    Breast cancer Neg Hx    Ovarian cancer Neg Hx    Colon cancer Neg Hx      Social History   Tobacco Use   Smoking status: Never   Smokeless tobacco: Never  Vaping Use   Vaping Use: Never used  Substance Use Topics   Alcohol use: No   Drug use: No    Allergies as of 06/24/2021   (No Known Allergies)    Review of Systems:    All systems reviewed and negative except where noted in HPI.   Physical Exam:  BP 132/88 (BP Location: Left Arm, Patient Position: Sitting, Cuff Size: Normal)   Pulse (!) 101   Temp 100 F (37.8 C) (Oral)   Ht 5\' 3"  (1.6 m)   Wt 178 lb 4 oz (80.9 kg)   BMI 31.58 kg/m  No LMP recorded.  General:   Alert,  Well-developed, well-nourished, pleasant and cooperative in NAD Head:  Normocephalic and atraumatic. Eyes:  Sclera clear, no icterus.   Conjunctiva pink. Ears:  Normal auditory acuity. Nose:  No deformity, discharge, or lesions. Mouth:  No deformity or lesions,oropharynx pink & moist. Neck:  Supple; no masses or thyromegaly. Lungs:  Respirations even and unlabored.  Clear throughout to auscultation.   No wheezes, crackles, or rhonchi. No acute distress. Heart:  Regular rate and rhythm; no murmurs, clicks, rubs, or gallops. Abdomen:  Normal bowel sounds. Soft, non-tender and non-distended without masses, hepatosplenomegaly or hernias noted.  No guarding or rebound tenderness.   Rectal: Not performed Msk:  Symmetrical without gross deformities. Good, equal movement & strength bilaterally. Pulses:  Normal pulses noted. Extremities:  No clubbing or edema.  No cyanosis. Neurologic:  Alert and oriented x3;  grossly normal  neurologically. Skin:  Intact without significant lesions or rashes. No jaundice. Psych:  Alert and cooperative. Normal mood and affect.  Imaging Studies: No abdominal imaging  Assessment and Plan:   Tina Simon is a 35 y.o. pleasant female is here for follow-up of chronic GERD and globus sensation.  EGD was unremarkable.  Her GI symptoms have significantly improved.  Continue omeprazole 20 mg 1-2 times daily.  Prescription sent Reiterated on healthy lifestyle modification which the patient is continuing to do  Follow up as needed   31, MD

## 2021-07-15 NOTE — Progress Notes (Signed)
[facility]   Chief Complaint  Patient presents with   Vaginal Discharge    Slight sour odor, slight irritation, no itchiness x 1 week    HPI:      [name] is a 35 y.o. G0P0000 whose LMP was Patient's last menstrual period was 06/28/2021 (exact date)., presents today for increased vag d/c with odor (not fishy to pt, but normal BV odor for pt), mild irritation and burning for the past wk. Treated for BV (atopobium) on culture 4/22 with flagyl without sx relief. Usually treated with flagyl for sx, but did clindamycin in 2019. Pt uses dove sens skin soap, no dryer sheets, wears cotton underwear, no thongs. Does wear panytliners regularly due to increased d/c. Has done boric acid in past when sx start and that helps with odor, but doesn't treat d/c. Hasn't tried as preventive. No prior abx use, no urin sx, no LBP, pelvic pain, fevers. Pt frustrated by recurrent sx off and on for several yrs.  She is sex active, no new partners, no pain/bleeding. Uses condoms.  Started OCPs 3/22 due to menometrorrhagia, requiring iron transfusions. Menses are monthly, lasting 5-6 days now, much lighter flow on heavy days, changing TID to QID (used to be hourly), no BTB, improved dysmen. Taking Fe daily. Has worsening CBC after stopping Fe transfusions, but due for repeat labs 11/22.    Past Medical History:  Diagnosis Date   Anemia    Diabetes mellitus without complication (HCC)    pre diabetes   Environmental allergies    GERD (gastroesophageal reflux disease)    HPV in female    IDA (iron deficiency anemia) 09/22/2020    Past Surgical History:  Procedure Laterality Date   BARTHOLIN CYST MARSUPIALIZATION     ESOPHAGOGASTRODUODENOSCOPY (EGD) WITH PROPOFOL N/A 02/05/2021   Procedure: ESOPHAGOGASTRODUODENOSCOPY (EGD) WITH PROPOFOL;  Surgeon: Toney Reil, MD;  Location: [facility] ENDOSCOPY;  Service: Gastroenterology;  Laterality: N/A;    Family History  Problem Relation Age of  Onset   Diabetes Mother    Hypertension Mother    Hypertension Father    Diabetes Father    Breast cancer Neg Hx    Ovarian cancer Neg Hx    Colon cancer Neg Hx     Social History   Socioeconomic History   Marital status: Single    Spouse name: Not on file   Number of children: Not on file   Years of education: Not on file   Highest education level: Not on file  Occupational History   Not on file  Tobacco Use   Smoking status: Never   Smokeless tobacco: Never  Vaping Use   Vaping Use: Never used  Substance and Sexual Activity   Alcohol use: No   Drug use: No   Sexual activity: Yes    Birth control/protection: Pill  Other Topics Concern   Not on file  Social History Narrative   Not on file   Social Determinants of Health   Financial Resource Strain: Not on file  Food Insecurity: Not on file  Transportation Needs: Not on file  Physical Activity: Not on file  Stress: Not on file  Social Connections: Not on file  Intimate Partner Violence: Not on file    Outpatient Medications Prior to Visit  Medication Sig Dispense Refill   cetirizine (ZYRTEC) 10 MG tablet Take 10 mg by mouth daily.     Cyanocobalamin 1000 MCG LOZG Place under the tongue.  fluticasone (FLONASE) 50 MCG/ACT nasal spray SPRAY 2 SPRAYS INTO EACH NOSTRIL EVERY DAY     Multiple Vitamins-Minerals (MULTIVITAMIN ADULT EXTRA C PO) Take by mouth.     omeprazole (PRILOSEC) 20 MG capsule Take 1 capsule (20 mg total) by mouth 2 (two) times daily before a meal. 60 capsule 2   Probiotic Product (PROBIOTIC-10 PO) Take by mouth.     metFORMIN (GLUCOPHAGE-XR) 500 MG 24 hr tablet Take by mouth.     montelukast (SINGULAIR) 10 MG tablet SMARTSIG:1 Tablet(s) By Mouth Every Evening (Patient not taking: Reported on 06/24/2021)     No facility-administered medications prior to visit.      ROS:  Review of Systems  Constitutional:  Negative for fever.  Gastrointestinal:  Negative for blood in stool, constipation,  diarrhea, nausea and vomiting.  Genitourinary:  Positive for vaginal discharge. Negative for dyspareunia, dysuria, flank pain, frequency, hematuria, urgency, vaginal bleeding and vaginal pain.  Musculoskeletal:  Negative for back pain.  Skin:  Negative for rash.  BREAST: No symptoms   OBJECTIVE:   Vitals:  BP 122/80   Ht 5\' 3"  (1.6 m)   Wt 180 lb (81.6 kg)   LMP 06/28/2021 (Exact Date)   BMI 31.89 kg/m   Physical Exam Vitals reviewed.  Constitutional:      Appearance: She is well-developed.  Pulmonary:     Effort: Pulmonary effort is normal.  Genitourinary:    General: Normal vulva.     Pubic Area: No rash.      Labia:        Right: No rash, tenderness or lesion.        Left: No rash, tenderness or lesion.      Vagina: Vaginal discharge present. No erythema or tenderness.     Cervix: Normal.     Uterus: Normal. Enlarged. Not tender.      Adnexa: Right adnexa normal and left adnexa normal.       Right: No mass or tenderness.         Left: No mass or tenderness.    Musculoskeletal:        General: Normal range of motion.     Cervical back: Normal range of motion.  Skin:    General: Skin is warm and dry.  Neurological:     General: No focal deficit present.     Mental Status: She is alert and oriented to person, place, and time.  Psychiatric:        Mood and Affect: Mood normal.        Behavior: Behavior normal.        Thought Content: Thought content normal.        Judgment: Judgment normal.    Results: Results for orders placed or performed in visit on 07/16/21 (from the past 24 hour(s))  POCT Wet Prep with KOH     Status: Abnormal   Collection Time: 07/16/21 10:31 AM  Result Value Ref Range   Trichomonas, UA Negative    Clue Cells Wet Prep HPF POC pos    Epithelial Wet Prep HPF POC     Yeast Wet Prep HPF POC neg    Bacteria Wet Prep HPF POC     RBC Wet Prep HPF POC     WBC Wet Prep HPF POC     KOH Prep POC Positive (A) Negative      Assessment/Plan: BV (bacterial vaginosis) - Plan: clindamycin (CLEOCIN) 300 MG capsule, POCT Wet Prep with KOH, Other/Misc lab test; pos sx  and wet prep. Rx clindamycin since atopobium on prior culture. Check MDL culture for AV, BV and yeast given recurrent sx. Will f/u with results. Try boric acid supp QHS for preventive.   Menorrhagia with regular cycle--improved with OCPs. Cont Fe supp. F/u with hematology for 11/22 labs.    Meds ordered this encounter  Medications   clindamycin (CLEOCIN) 300 MG capsule    Sig: Take 1 capsule (300 mg total) by mouth 2 (two) times daily for 7 days.    Dispense:  14 capsule    Refill:  0    Order Specific Question:   Supervising Provider    Answer:   Nadara Mustard [[id number]]       Return if symptoms worsen or fail to improve.  Latunya Kissick B. Coyt Govoni, PA-C 07/16/2021 10:34 AM

## 2021-07-16 ENCOUNTER — Other Ambulatory Visit: Payer: Self-pay

## 2021-07-16 ENCOUNTER — Encounter: Payer: Self-pay | Admitting: Obstetrics and Gynecology

## 2021-07-16 ENCOUNTER — Ambulatory Visit (INDEPENDENT_AMBULATORY_CARE_PROVIDER_SITE_OTHER): Payer: PRIVATE HEALTH INSURANCE | Admitting: Obstetrics and Gynecology

## 2021-07-16 VITALS — BP 122/80 | Ht 63.0 in | Wt 180.0 lb

## 2021-07-16 DIAGNOSIS — N920 Excessive and frequent menstruation with regular cycle: Secondary | ICD-10-CM

## 2021-07-16 DIAGNOSIS — N760 Acute vaginitis: Secondary | ICD-10-CM

## 2021-07-16 DIAGNOSIS — N898 Other specified noninflammatory disorders of vagina: Secondary | ICD-10-CM

## 2021-07-16 DIAGNOSIS — B9689 Other specified bacterial agents as the cause of diseases classified elsewhere: Secondary | ICD-10-CM

## 2021-07-16 LAB — POCT WET PREP WITH KOH
Clue Cells Wet Prep HPF POC: POSITIVE
KOH Prep POC: POSITIVE — AB
Trichomonas, UA: NEGATIVE
Yeast Wet Prep HPF POC: NEGATIVE

## 2021-07-16 MED ORDER — CLINDAMYCIN HCL 300 MG PO CAPS: 300.0000 mg | ORAL_CAPSULE | Freq: Two times a day (BID) | ORAL | 0 refills | Status: DC

## 2021-07-20 ENCOUNTER — Encounter: Payer: Self-pay | Admitting: Obstetrics and Gynecology

## 2021-07-20 NOTE — Telephone Encounter (Signed)
Pt calling; antibx is causing a lot of discomfort.  [phone]

## 2021-07-28 ENCOUNTER — Telehealth: Payer: Self-pay | Admitting: Obstetrics and Gynecology

## 2021-07-28 NOTE — Telephone Encounter (Signed)
LM with One swab results which confirms 4 types BV but was neg for AV and yeast. Pt on clindamycin which works better for atopobium. Pt already instructed to start boric acid supp after tx. F/u prn.

## 2021-08-03 ENCOUNTER — Ambulatory Visit
Admission: EM | Admit: 2021-08-03 | Discharge: 2021-08-03 | Disposition: A | Discharge status: Home / Self Care | Payer: PRIVATE HEALTH INSURANCE | Attending: Emergency Medicine | Admitting: Emergency Medicine

## 2021-08-03 ENCOUNTER — Other Ambulatory Visit: Payer: Self-pay

## 2021-08-03 DIAGNOSIS — J069 Acute upper respiratory infection, unspecified: Secondary | ICD-10-CM | POA: Diagnosis present

## 2021-08-03 DIAGNOSIS — Z20822 Contact with and (suspected) exposure to covid-19: Secondary | ICD-10-CM | POA: Insufficient documentation

## 2021-08-03 DIAGNOSIS — Z79899 Other long term (current) drug therapy: Secondary | ICD-10-CM | POA: Diagnosis not present

## 2021-08-03 LAB — SARS CORONAVIRUS 2 (TAT 6-24 HRS): SARS Coronavirus 2: NEGATIVE

## 2021-08-03 MED ORDER — IPRATROPIUM BROMIDE 0.06 % NA SOLN: 2.0000 | Freq: Four times a day (QID) | NASAL | 12 refills | Status: DC

## 2021-08-03 NOTE — Discharge Instructions (Signed)
Use the Atrovent nasal spray, 2 squirts in each nostril every 6 hours, as needed for runny nose and postnasal drip.  Perform sinus irrigation with a Neilmed sinus rinse kit and distilled water, 2-3 times a day. Do not use tap water.   Return for reevaluation or see your primary care provider for any new or worsening symptoms.

## 2021-08-03 NOTE — ED Triage Notes (Signed)
Pt presents with a scratchy throat and headache that started on Wednesday. Reports Friday she began experiencing body aches and general fatigue. Pt denies any fever. Denies relief with otc medications.

## 2021-08-03 NOTE — ED Provider Notes (Signed)
[facility]    CSN: [id number] Arrival date & time: 08/03/21  0827      History   Chief Complaint Chief Complaint  Patient presents with   bodyaches    HPI Tina Simon is a 35 y.o. female.   HPI  35 year old female here for evaluation of respiratory complaints.  Patient reports that 5 days ago she developed a scratchy throat, runny nose, and headache.  She treated this with TheraFlu which helped resolve her runny nose and nasal congestion.  3 days ago she developed body aches and fatigue.  She did have diarrhea for the first 2 days of her illness but that too has resolved.  She states that her entire floor at work has had similar symptoms.  No known COVID infections though.  She denies ear pain, cough, nausea, or vomiting.  Past Medical History:  Diagnosis Date   Anemia    Diabetes mellitus without complication ([place])    pre diabetes   Environmental allergies    GERD (gastroesophageal reflux disease)    HPV in female    IDA (iron deficiency anemia) 09/22/2020    Patient Active Problem List   Diagnosis Date Noted   Menorrhagia with regular cycle 07/16/2021   Dysphagia    IDA (iron deficiency anemia) 09/22/2020   Abnormal EKG 09/18/2020   Bartholin cyst 09/18/2020   Elevated blood pressure reading without diagnosis of hypertension 09/18/2020   Family history of heart murmur 09/18/2020   History of abnormal cervical Pap smear 09/29/2017   Heart murmur 04/14/2012    Past Surgical History:  Procedure Laterality Date   BARTHOLIN CYST MARSUPIALIZATION     ESOPHAGOGASTRODUODENOSCOPY (EGD) WITH PROPOFOL N/A 02/05/2021   Procedure: ESOPHAGOGASTRODUODENOSCOPY (EGD) WITH PROPOFOL;  Surgeon: Lin Landsman, MD;  Location: [facility];  Service: Gastroenterology;  Laterality: N/A;    OB History     Gravida  0   Para  0   Term  0   Preterm  0   AB  0   Living  0      SAB  0   IAB  0   Ectopic  0   Multiple  0   Live Births  0             Home Medications    Prior to Admission medications   Medication Sig Start Date End Date Taking? Authorizing Provider  ipratropium (ATROVENT) 0.06 % nasal spray Place 2 sprays into both nostrils 4 (four) times daily. 08/03/21  Yes Margarette Canada, NP  cetirizine (ZYRTEC) 10 MG tablet Take 10 mg by mouth daily. 08/13/20   [provider]  Cyanocobalamin 1000 MCG LOZG Place under the tongue.    [provider]  fluticasone (FLONASE) 50 MCG/ACT nasal spray SPRAY 2 SPRAYS INTO EACH NOSTRIL EVERY DAY 12/25/20   [provider]  metFORMIN (GLUCOPHAGE-XR) 500 MG 24 hr tablet Take by mouth. 06/11/20 06/11/21  [provider]  Multiple Vitamins-Minerals (MULTIVITAMIN ADULT EXTRA C PO) Take by mouth.    [provider]  omeprazole (PRILOSEC) 20 MG capsule Take 1 capsule (20 mg total) by mouth 2 (two) times daily before a meal. 06/24/21 07/24/21  Vanga, Tally Due, MD  Probiotic Product (PROBIOTIC-10 PO) Take by mouth.    [provider]  ferrous sulfate 325 (65 FE) MG tablet Take 325 mg by mouth daily. 08/18/20 12/18/20  [provider]  triamcinolone (NASACORT) 55 MCG/ACT AERO nasal inhaler Place 2 sprays into the nose daily.  12/18/20  [provider]    Family History Family History  Problem Relation Age of Onset   Diabetes Mother    Hypertension Mother    Hypertension Father    Diabetes Father    Breast cancer Neg Hx    Ovarian cancer Neg Hx    Colon cancer Neg Hx     Social History Social History   Tobacco Use   Smoking status: Never   Smokeless tobacco: Never  Vaping Use   Vaping Use: Never used  Substance Use Topics   Alcohol use: No   Drug use: No     Allergies   Patient has no known allergies.   Review of Systems Review of Systems  Constitutional:  Negative for activity change, appetite change and fever.  HENT:  Positive for congestion, rhinorrhea, sinus pressure and sore throat. Negative for  ear pain.   Respiratory:  Negative for cough, shortness of breath and wheezing.   Gastrointestinal:  Positive for diarrhea. Negative for nausea and vomiting.  Musculoskeletal:  Positive for arthralgias and myalgias.  Skin:  Negative for rash.  Neurological:  Positive for headaches.  Hematological: Negative.   Psychiatric/Behavioral: Negative.      Physical Exam Triage Vital Signs ED Triage Vitals  Enc Vitals Group     BP 08/03/21 0920 (!) 144/99     Pulse Rate 08/03/21 0920 85     Resp 08/03/21 0920 19     Temp 08/03/21 0920 98.6 F (37 C)     Temp src --      SpO2 08/03/21 0920 100 %     Weight --      Height --      Head Circumference --      Peak Flow --      Pain Score 08/03/21 0916 9     Pain Loc --      Pain Edu? --      Excl. in [place]? --    No data found.  Updated Vital Signs BP (!) 144/99   Pulse 85   Temp 98.6 F (37 C)   Resp 19   LMP 07/23/2021   SpO2 100%   Visual Acuity Right Eye Distance:   Left Eye Distance:   Bilateral Distance:    Right Eye Near:   Left Eye Near:    Bilateral Near:     Physical Exam Vitals and nursing note reviewed.  Constitutional:      General: She is not in acute distress.    Appearance: Normal appearance. She is not ill-appearing.  HENT:     Head: Normocephalic and atraumatic.     Right Ear: Tympanic membrane, ear canal and external ear normal. There is no impacted cerumen.     Left Ear: Tympanic membrane, ear canal and external ear normal. There is no impacted cerumen.     Nose: Congestion and rhinorrhea present.     Mouth/Throat:     Mouth: Mucous membranes are moist.     Pharynx: Oropharynx is clear. Posterior oropharyngeal erythema present.  Cardiovascular:     Rate and Rhythm: Normal rate and regular rhythm.     Pulses: Normal pulses.     Heart sounds: Normal heart sounds. No murmur heard.   No gallop.  Pulmonary:     Effort: Pulmonary effort is normal.     Breath sounds: Normal breath sounds. No wheezing,  rhonchi or rales.  Musculoskeletal:     Cervical back: Normal range of motion and neck supple.  Lymphadenopathy:     Cervical: No cervical adenopathy.  Skin:    General: Skin is warm and dry.     Capillary Refill: Capillary refill takes less than 2 seconds.     Findings: No erythema or rash.  Neurological:     General: No focal deficit present.     Mental Status: She is alert and oriented to person, place, and time.  Psychiatric:        Mood and Affect: Mood normal.        Behavior: Behavior normal.        Thought Content: Thought content normal.        Judgment: Judgment normal.     UC Treatments / Results  Labs (all labs ordered are listed, but only abnormal results are displayed) Labs Reviewed  SARS CORONAVIRUS 2 (TAT 6-24 HRS)    EKG   Radiology No results found.  Procedures Procedures (including critical care time)  Medications Ordered in UC Medications - No data to display  Initial Impression / Assessment and Plan / UC Course  I have reviewed the triage vital signs and the nursing notes.  Pertinent labs & imaging results that were available during my care of the patient were reviewed by me and considered in my medical decision making (see chart for details).  Patient is a nontoxic-appearing 66 old female here for evaluation of, scratchy throat, headache, body aches, fatigue, and clear nasal discharge that has been going for the last 5 days.  She states her most profound symptoms are the fatigue and body aches.  She has been taking TheraFlu which is helped with her nasal congestion but has not resolved any of the body aches.  Where she works there are similar symptoms and a lot of folks on her floor have fallen ill.  No known COVID exposure.  Patient's physical exam reveals protegrin tympanic membranes bilaterally with a normal light reflex and clear external auditory canals.  Nasal mucosa is mildly erythematous and edematous with scant clear nasal discharge.   Oropharyngeal exam reveals mild posterior oropharyngeal erythema with clear postnasal drip.  No cervical lymphadenopathy appreciated exam.  Cardiopulmonary exam reveals clear lung sounds in all fields.  Patient's exam is consistent with a viral URI.  Will give Atrovent nasal spray to help her with the nasal congestion.  Patient is not experiencing any cough symptoms so there is no need to treat that at this time.  Work note provided.  COVID swab collected at triage.   Final Clinical Impressions(s) / UC Diagnoses   Final diagnoses:  Acute URI     Discharge Instructions      Use the Atrovent nasal spray, 2 squirts in each nostril every 6 hours, as needed for runny nose and postnasal drip.  Perform sinus irrigation with a Neilmed sinus rinse kit and distilled water, 2-3 times a day. Do not use tap water.   Return for reevaluation or see your primary care provider for any new or worsening symptoms.      ED Prescriptions     Medication Sig Dispense Auth. Provider   ipratropium (ATROVENT) 0.06 % nasal spray Place 2 sprays into both nostrils 4 (four) times daily. 15 mL Margarette Canada, NP      PDMP not reviewed this encounter.   Margarette Canada, NP 08/03/21 [phone]

## 2021-08-24 ENCOUNTER — Encounter: Payer: Self-pay | Admitting: Oncology

## 2021-08-25 ENCOUNTER — Encounter: Payer: Self-pay | Admitting: Oncology

## 2021-08-25 ENCOUNTER — Ambulatory Visit (INDEPENDENT_AMBULATORY_CARE_PROVIDER_SITE_OTHER): Payer: PRIVATE HEALTH INSURANCE | Admitting: Podiatry

## 2021-08-25 ENCOUNTER — Other Ambulatory Visit: Payer: Self-pay

## 2021-08-25 DIAGNOSIS — L600 Ingrowing nail: Secondary | ICD-10-CM

## 2021-08-25 MED ORDER — GENTAMICIN SULFATE 0.1 % EX CREA: 1.0000 "application " | TOPICAL_CREAM | Freq: Three times a day (TID) | CUTANEOUS | 0 refills | Status: DC

## 2021-08-25 NOTE — Patient Instructions (Signed)

## 2021-08-27 ENCOUNTER — Telehealth: Payer: Self-pay | Admitting: *Deleted

## 2021-08-27 NOTE — Telephone Encounter (Signed)
"  I was calling because I was there on Tuesday and had a removal of an ingrown toenail.  I'm having some slight discomfort and it's going up the back of my lower leg.  So, I wasn't sure if that is normal.  Can someone give me a call back?

## 2021-08-28 ENCOUNTER — Telehealth: Payer: PRIVATE HEALTH INSURANCE | Admitting: Physician Assistant

## 2021-08-28 DIAGNOSIS — L0292 Furuncle, unspecified: Secondary | ICD-10-CM

## 2021-08-28 MED ORDER — DOXYCYCLINE HYCLATE 100 MG PO TABS
100.0000 mg | ORAL_TABLET | Freq: Two times a day (BID) | ORAL | 0 refills | Status: DC
Start: 2021-08-28 — End: 2024-08-24

## 2021-08-28 NOTE — Patient Instructions (Signed)
Tina Simon, thank you for joining Margaretann Loveless, PA-C for today's virtual visit.  While this provider is not your primary care provider (PCP), if your PCP is located in our provider database this encounter information will be shared with them immediately following your visit.  Consent: (Patient) Tina Simon provided verbal consent for this virtual visit at the beginning of the encounter.  Current Medications:  Current Outpatient Medications:    doxycycline (VIBRA-TABS) 100 MG tablet, Take 1 tablet (100 mg total) by mouth 2 (two) times daily., Disp: 14 tablet, Rfl: 0   cetirizine (ZYRTEC) 10 MG tablet, Take 10 mg by mouth daily., Disp: , Rfl:    Cyanocobalamin 1000 MCG LOZG, Place under the tongue., Disp: , Rfl:    fluticasone (FLONASE) 50 MCG/ACT nasal spray, SPRAY 2 SPRAYS INTO EACH NOSTRIL EVERY DAY, Disp: , Rfl:    gentamicin cream (GARAMYCIN) 0.1 %, Apply 1 application topically 3 (three) times daily., Disp: 15 g, Rfl: 0   ipratropium (ATROVENT) 0.06 % nasal spray, Place 2 sprays into both nostrils 4 (four) times daily., Disp: 15 mL, Rfl: 12   metFORMIN (GLUCOPHAGE-XR) 500 MG 24 hr tablet, Take by mouth., Disp: , Rfl:    Multiple Vitamins-Minerals (MULTIVITAMIN ADULT EXTRA C PO), Take by mouth., Disp: , Rfl:    omeprazole (PRILOSEC) 20 MG capsule, Take 1 capsule (20 mg total) by mouth 2 (two) times daily before a meal., Disp: 60 capsule, Rfl: 2   Probiotic Product (PROBIOTIC-10 PO), Take by mouth., Disp: , Rfl:    Medications ordered in this encounter:  Meds ordered this encounter  Medications   doxycycline (VIBRA-TABS) 100 MG tablet    Sig: Take 1 tablet (100 mg total) by mouth 2 (two) times daily.    Dispense:  14 tablet    Refill:  0    Order Specific Question:   Supervising Provider    Answer:   Hyacinth Meeker, BRIAN [[id number]]     *If you need refills on other medications prior to your next appointment, please contact your pharmacy*  Follow-Up: Call back or seek an  in-person evaluation if the symptoms worsen or if the condition fails to improve as anticipated.  Other Instructions Ingrown Hair An ingrown hair is a hair that curls and re-enters the skin instead of growing straight out of the skin. An ingrown hair can develop in any part of the skin that hair is removed from. An ingrown hair may cause small pockets of infection. What are the causes? An ingrown hair may be caused by: Shaving. Tweezing. Waxing. Using a hair removal cream. What increases the risk? You are more likely to develop this condition if you have curly hair. What are the signs or symptoms? Symptoms of an ingrown hair may include: Small bumps on the skin. The bumps may be filled with pus. Pain. Itching. How is this diagnosed? An ingrown hair is diagnosed by a skin exam. How is this treated? Treatment is often not needed unless the ingrown hair has caused an infection. If needed, treatment may include: Applying prescription creams to the skin. This can help with inflammation. Applying warm compresses to the skin. This can help soften the skin. Taking antibiotic medicine. An antibiotic may be prescribed if the infection is severe. Retracting and releasing the ingrown hair tips. Follow these instructions at home: Medicines Take, apply, or use over-the-counter and prescription medicines only as told by your health care provider. This includes any prescription creams. If you were prescribed an antibiotic  medicine, take it as told by your health care provider. Do not stop using the antibiotic even if you start to feel better. General instructions Do not shave irritated areas of skin. You may start shaving these areas again once the irritation has gone away. To help remove ingrown hairs on your face, you may use a facial sponge in a gentle circular motion. Do not pick or squeeze the irritated area of skin as this may cause infection and scarring. Managing pain and swelling If  directed, apply heat to the affected area as often as told by your health care provider. Use the heat source that your health care provider recommends, such as a moist heat pack or a heating pad. Place a towel between your skin and the heat source. Leave the heat on for 20-30 minutes. Remove the heat if your skin turns bright red. This is especially important if you are unable to feel pain, heat, or cold. You may have a greater risk of getting burned. How is this prevented? Shower before shaving. Wrap areas that you are going to shave in warm, moist wraps for several minutes before shaving. The warmth and moisture help to soften the hairs and makes ingrown hairs less likely. Use thick shaving gels. Use a razor that cuts hair slightly above your skin, or use an electric shaver with a long shave setting. Shave in the direction of hair growth. Avoid making multiple razor strokes. Apply a moisturizing lotion after shaving. Summary An ingrown hair is a hair that curls and re-enters the skin instead of growing straight out of the skin. Treatment is often not needed unless the ingrown hair has caused an infection. Take, apply, or use over-the-counter and prescription medicines only as told by your health care provider. This includes any prescription creams. Do not shave irritated areas of skin. You may start shaving these areas again once the irritation has gone away. If directed, apply heat to the affected area. Use the heat source that your health care provider recommends, such as a moist heat pack or a heating pad. This information is not intended to replace advice given to you by your health care provider. Make sure you discuss any questions you have with your health care provider. Document Revised: 04/26/2018 Document Reviewed: 04/26/2018 Elsevier Patient Education  2022 [employer].    If you have been instructed to have an in-person evaluation today at a local Urgent Care facility, please  use the link below. It will take you to a list of all of our available [facility], including address, phone number and hours of operation. Please do not delay care.  [facility]  If you or a family member do not have a primary care provider, use the link below to schedule a visit and establish care. When you choose a [facility] primary care physician or advanced practice provider, you gain a long-term partner in health. Find a Primary Care Provider  Learn more about [facility]'s in-office and virtual care options: [facility] - Get Care Now

## 2021-08-28 NOTE — Telephone Encounter (Signed)
I called and gave her Dr. Logan Bores' response.  She stated everyday it's feeling better. She said it's not red or swollen in the calf. It's just a little sore.  I advised her to do the calf stretches that Dr. Logan Bores recommended.

## 2021-08-28 NOTE — Telephone Encounter (Signed)
Please contact patient.  It is most likely because she is walking abnormally or favoring the toe.  A small ingrown procedure would not cause DVT.  Recommend that she does calf stretches throughout the day and this should help.  I will see her on neck scheduled appointment.  Thanks, Dr. Logan Bores

## 2021-08-28 NOTE — Progress Notes (Signed)
Virtual Visit Consent   Tina Simon, you are scheduled for a virtual visit with a [facility] provider today.     Just as with appointments in the office, your consent must be obtained to participate.  Your consent will be active for this visit and any virtual visit you may have with one of our providers in the next 365 days.     If you have a MyChart account, a copy of this consent can be sent to you electronically.  All virtual visits are billed to your insurance company just like a traditional visit in the office.    As this is a virtual visit, video technology does not allow for your provider to perform a traditional examination.  This may limit your provider's ability to fully assess your condition.  If your provider identifies any concerns that need to be evaluated in person or the need to arrange testing (such as labs, EKG, etc.), we will make arrangements to do so.     Although advances in technology are sophisticated, we cannot ensure that it will always work on either your end or our end.  If the connection with a video visit is poor, the visit may have to be switched to a telephone visit.  With either a video or telephone visit, we are not always able to ensure that we have a secure connection.     I need to obtain your verbal consent now.   Are you willing to proceed with your visit today?    MONSERRAT VIDAURRI has provided verbal consent on 08/28/2021 for a virtual visit (video or telephone).   Margaretann Loveless, PA-C   Date: 08/28/2021 7:07 PM   Virtual Visit via Video Note   I, Margaretann Loveless, connected with  Tina Simon  ([id number], 1986/04/02) on 08/28/21 at  7:00 PM EDT by a video-enabled telemedicine application and verified that I am speaking with the correct person using two identifiers.  Location: Patient: Virtual Visit Location Patient: Home Provider: Virtual Visit Location Provider: Home Office   I discussed the limitations of evaluation and management  by telemedicine and the availability of in person appointments. The patient expressed understanding and agreed to proceed.    History of Present Illness: Tina Simon is a 35 y.o. who identifies as a female who was assigned female at birth, and is being seen today for boil in the right inguinal region. Started over a week ago. Now with purulent drainage, tenderness. Denies fever, chills, nausea or vomiting.    Problems:  Patient Active Problem List   Diagnosis Date Noted   Menorrhagia with regular cycle 07/16/2021   Dysphagia    IDA (iron deficiency anemia) 09/22/2020   Abnormal EKG 09/18/2020   Bartholin cyst 09/18/2020   Elevated blood pressure reading without diagnosis of hypertension 09/18/2020   Family history of heart murmur 09/18/2020   History of abnormal cervical Pap smear 09/29/2017   Heart murmur 04/14/2012    Allergies: No Known Allergies Medications:  Current Outpatient Medications:    doxycycline (VIBRA-TABS) 100 MG tablet, Take 1 tablet (100 mg total) by mouth 2 (two) times daily., Disp: 14 tablet, Rfl: 0   cetirizine (ZYRTEC) 10 MG tablet, Take 10 mg by mouth daily., Disp: , Rfl:    Cyanocobalamin 1000 MCG LOZG, Place under the tongue., Disp: , Rfl:    fluticasone (FLONASE) 50 MCG/ACT nasal spray, SPRAY 2 SPRAYS INTO EACH NOSTRIL EVERY DAY, Disp: , Rfl:    gentamicin  cream (GARAMYCIN) 0.1 %, Apply 1 application topically 3 (three) times daily., Disp: 15 g, Rfl: 0   ipratropium (ATROVENT) 0.06 % nasal spray, Place 2 sprays into both nostrils 4 (four) times daily., Disp: 15 mL, Rfl: 12   metFORMIN (GLUCOPHAGE-XR) 500 MG 24 hr tablet, Take by mouth., Disp: , Rfl:    Multiple Vitamins-Minerals (MULTIVITAMIN ADULT EXTRA C PO), Take by mouth., Disp: , Rfl:    omeprazole (PRILOSEC) 20 MG capsule, Take 1 capsule (20 mg total) by mouth 2 (two) times daily before a meal., Disp: 60 capsule, Rfl: 2   Probiotic Product (PROBIOTIC-10 PO), Take by mouth., Disp: , Rfl:    Observations/Objective: Patient is well-developed, well-nourished in no acute distress.  Resting comfortably at home.  Head is normocephalic, atraumatic.  No labored breathing.  Speech is clear and coherent with logical content.  Patient is alert and oriented at baseline.    Assessment and Plan: 1. Boil - doxycycline (VIBRA-TABS) 100 MG tablet; Take 1 tablet (100 mg total) by mouth 2 (two) times daily.  Dispense: 14 tablet; Refill: 0  - Suspect ingrown hair or boil - Will treat with doxycycline as above - Warm compresses - Seek in person evaluation if symptoms worsen or fail to improve  Follow Up Instructions: I discussed the assessment and treatment plan with the patient. The patient was provided an opportunity to ask questions and all were answered. The patient agreed with the plan and demonstrated an understanding of the instructions.  A copy of instructions were sent to the patient via MyChart unless otherwise noted below.    The patient was advised to call back or seek an in-person evaluation if the symptoms worsen or if the condition fails to improve as anticipated.  Time:  I spent 10 minutes with the patient via telehealth technology discussing the above problems/concerns.    Margaretann Loveless, PA-C

## 2021-09-06 NOTE — Progress Notes (Signed)
   Subjective: Patient presents today for evaluation of pain to the right great toe. Patient is concerned for possible ingrown nail.  It is very sensitive to touch.  Patient presents today for further treatment and evaluation.  Past Medical History:  Diagnosis Date   Anemia    Diabetes mellitus without complication (HCC)    pre diabetes   Environmental allergies    GERD (gastroesophageal reflux disease)    HPV in female    IDA (iron deficiency anemia) 09/22/2020    Objective:  General: Well developed, nourished, in no acute distress, alert and oriented x3   Dermatology: Skin is warm, dry and supple bilateral.  Lateral border right great toe appears to be erythematous with evidence of an ingrowing nail. Pain on palpation noted to the border of the nail fold. The remaining nails appear unremarkable at this time. There are no open sores, lesions.  Vascular: Dorsalis Pedis artery and Posterior Tibial artery pedal pulses palpable. No lower extremity edema noted.   Neruologic: Grossly intact via light touch bilateral.  Musculoskeletal: Muscular strength within normal limits in all groups bilateral. Normal range of motion noted to all pedal and ankle joints.   Assesement: #1 Paronychia with ingrowing nail lateral border right great toe #2 Pain in toe  Plan of Care:  1. Patient evaluated.  2. Discussed treatment alternatives and plan of care. Explained nail avulsion procedure and post procedure course to patient. 3. Patient opted for permanent partial nail avulsion of the ingrown portion of the nail.  4. Prior to procedure, local anesthesia infiltration utilized using 3 ml of a 50:50 mixture of 2% plain lidocaine and 0.5% plain marcaine in a normal hallux block fashion and a betadine prep performed.  5. Partial permanent nail avulsion with chemical matrixectomy performed using 3x30sec applications of phenol followed by alcohol flush.  6. Light dressing applied.  Post care instructions  provided 7.  Prescription for gentamicin 2% cream  8.  Return to clinic 2 weeks.  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  Felecia Shelling, DPM [facility]  Dr. Felecia Shelling, DPM    [address] [place], [place] [address]                Office [phone]  Fax [phone]

## 2021-09-11 ENCOUNTER — Ambulatory Visit (INDEPENDENT_AMBULATORY_CARE_PROVIDER_SITE_OTHER): Payer: PRIVATE HEALTH INSURANCE | Admitting: Podiatry

## 2021-09-11 ENCOUNTER — Other Ambulatory Visit: Payer: Self-pay

## 2021-09-11 DIAGNOSIS — L600 Ingrowing nail: Secondary | ICD-10-CM

## 2021-09-11 NOTE — Progress Notes (Signed)
   Subjective: 35 y.o. female presents today status post permanent nail avulsion procedure of the lateral border right great toe that was performed on 08/25/2021.  Patient states that she does feel much better.  She is concerned with the appearance of the nail avulsion site.  She presents for further treatment evaluation.  She has been soaking her foot and applying antibiotic cream as instructed.   Past Medical History:  Diagnosis Date   Anemia    Diabetes mellitus without complication (HCC)    pre diabetes   Environmental allergies    GERD (gastroesophageal reflux disease)    HPV in female    IDA (iron deficiency anemia) 09/22/2020    Objective: Skin is warm, dry and supple. Nail and respective nail fold appears to be healing appropriately. Open wound to the associated nail fold with a granular wound base and moderate amount of fibrotic tissue. Minimal drainage noted. Mild erythema around the periungual region likely due to phenol chemical matricectomy.  Assessment: #1 s/p partial permanent nail matrixectomy lateral border right great toe   Plan of care: #1 patient was evaluated  #2 light debridement of open wound was performed to the periungual border of the respective toe using a currette. Antibiotic ointment and Band-Aid was applied. #3 patient is to return to clinic on a PRN basis.  *[facility] in [place], [place].  [facility] imposter department   Felecia Shelling, DPM [facility]  Dr. Felecia Shelling, DPM    [address] [place], [place] [address]                Office [phone]  Fax [phone]

## 2021-09-29 ENCOUNTER — Other Ambulatory Visit: Payer: Self-pay | Admitting: Obstetrics and Gynecology

## 2021-09-29 ENCOUNTER — Encounter: Payer: Self-pay | Admitting: Obstetrics and Gynecology

## 2021-09-29 DIAGNOSIS — N760 Acute vaginitis: Secondary | ICD-10-CM

## 2021-09-29 DIAGNOSIS — B9689 Other specified bacterial agents as the cause of diseases classified elsewhere: Secondary | ICD-10-CM

## 2021-09-29 MED ORDER — CLINDAMYCIN HCL 300 MG PO CAPS: 300.0000 mg | ORAL_CAPSULE | Freq: Two times a day (BID) | ORAL | 0 refills | Status: AC

## 2021-09-29 NOTE — Progress Notes (Signed)
Rx RF clindamycin for recurrent BV sx ?

## 2021-10-26 ENCOUNTER — Other Ambulatory Visit: Payer: Self-pay | Admitting: Gastroenterology

## 2021-10-27 ENCOUNTER — Other Ambulatory Visit: Payer: Self-pay

## 2021-10-27 MED ORDER — OMEPRAZOLE 20 MG PO CPDR: 20.0000 mg | DELAYED_RELEASE_CAPSULE | Freq: Two times a day (BID) | ORAL | 2 refills | Status: DC

## 2021-10-27 NOTE — Progress Notes (Signed)
sent in refill as requested by pharmacy

## 2022-05-26 ENCOUNTER — Encounter: Payer: Self-pay | Admitting: Oncology

## 2022-05-26 ENCOUNTER — Telehealth: Payer: Managed Care, Other (non HMO) | Admitting: Physician Assistant

## 2022-05-26 DIAGNOSIS — B9689 Other specified bacterial agents as the cause of diseases classified elsewhere: Secondary | ICD-10-CM

## 2022-05-26 DIAGNOSIS — N760 Acute vaginitis: Secondary | ICD-10-CM | POA: Diagnosis not present

## 2022-05-26 MED ORDER — METRONIDAZOLE 500 MG PO TABS: 500.0000 mg | ORAL_TABLET | Freq: Two times a day (BID) | ORAL | 0 refills | Status: AC

## 2022-05-26 NOTE — Progress Notes (Signed)
Virtual Visit Consent   Tina Simon, you are scheduled for a virtual visit with a [facility] provider today. Just as with appointments in the office, your consent must be obtained to participate. Your consent will be active for this visit and any virtual visit you may have with one of our providers in the next 365 days. If you have a MyChart account, a copy of this consent can be sent to you electronically.  As this is a virtual visit, video technology does not allow for your provider to perform a traditional examination. This may limit your provider's ability to fully assess your condition. If your provider identifies any concerns that need to be evaluated in person or the need to arrange testing (such as labs, EKG, etc.), we will make arrangements to do so. Although advances in technology are sophisticated, we cannot ensure that it will always work on either your end or our end. If the connection with a video visit is poor, the visit may have to be switched to a telephone visit. With either a video or telephone visit, we are not always able to ensure that we have a secure connection.  By engaging in this virtual visit, you consent to the provision of healthcare and authorize for your insurance to be billed (if applicable) for the services provided during this visit. Depending on your insurance coverage, you may receive a charge related to this service.  I need to obtain your verbal consent now. Are you willing to proceed with your visit today? TYAIRA HEWARD has provided verbal consent on 05/26/2022 for a virtual visit (video or telephone). Margaretann Loveless, PA-C  Date: 05/26/2022 7:33 PM  Virtual Visit via Video Note   I, Margaretann Loveless, connected with  Tina Simon  ([id number], Jun 23, 1986) on 05/26/22 at  7:30 PM EDT by a video-enabled telemedicine application and verified that I am speaking with the correct person using two identifiers.  Location: Patient: Virtual Visit Location  Patient: Home Provider: Virtual Visit Location Provider: Home Office   I discussed the limitations of evaluation and management by telemedicine and the availability of in person appointments. The patient expressed understanding and agreed to proceed.    History of Present Illness: Tina Simon is a 36 y.o. who identifies as a female who was assigned female at birth, and is being seen today for vaginal discharge and odor.  HPI: Vaginal Discharge The patient's primary symptoms include a genital odor and vaginal discharge. The patient's pertinent negatives include no genital itching. This is a new problem. The current episode started in the past 7 days. The problem occurs constantly. The problem has been gradually worsening. The patient is experiencing no pain. She is not pregnant. Pertinent negatives include no abdominal pain, chills, dysuria, fever, flank pain, frequency, hematuria, rash or urgency. The vaginal discharge was watery, white and malodorous. Treatments tried: boric acid suppositories, azo vaginal probiotics. Her past medical history is significant for vaginosis.      Problems:  Patient Active Problem List   Diagnosis Date Noted   Menorrhagia with regular cycle 07/16/2021   Dysphagia    IDA (iron deficiency anemia) 09/22/2020   Abnormal EKG 09/18/2020   Bartholin cyst 09/18/2020   Elevated blood pressure reading without diagnosis of hypertension 09/18/2020   Family history of heart murmur 09/18/2020   History of abnormal cervical Pap smear 09/29/2017   Heart murmur 04/14/2012    Allergies: No Known Allergies Medications:  Current Outpatient Medications:  metroNIDAZOLE (FLAGYL) 500 MG tablet, Take 1 tablet (500 mg total) by mouth 2 (two) times daily for 7 days., Disp: 14 tablet, Rfl: 0   cetirizine (ZYRTEC) 10 MG tablet, Take 10 mg by mouth daily., Disp: , Rfl:    Cyanocobalamin 1000 MCG LOZG, Place under the tongue., Disp: , Rfl:    doxycycline (VIBRA-TABS) 100 MG  tablet, Take 1 tablet (100 mg total) by mouth 2 (two) times daily., Disp: 14 tablet, Rfl: 0   fluticasone (FLONASE) 50 MCG/ACT nasal spray, SPRAY 2 SPRAYS INTO EACH NOSTRIL EVERY DAY, Disp: , Rfl:    gentamicin cream (GARAMYCIN) 0.1 %, Apply 1 application topically 3 (three) times daily., Disp: 15 g, Rfl: 0   ipratropium (ATROVENT) 0.06 % nasal spray, Place 2 sprays into both nostrils 4 (four) times daily., Disp: 15 mL, Rfl: 12   metFORMIN (GLUCOPHAGE-XR) 500 MG 24 hr tablet, Take by mouth., Disp: , Rfl:    Multiple Vitamins-Minerals (MULTIVITAMIN ADULT EXTRA C PO), Take by mouth., Disp: , Rfl:    omeprazole (PRILOSEC) 20 MG capsule, TAKE 1 CAPSULE (20 MG TOTAL) BY MOUTH 2 (TWO) TIMES DAILY BEFORE A MEAL., Disp: 60 capsule, Rfl: 2   omeprazole (PRILOSEC) 20 MG capsule, Take 1 capsule (20 mg total) by mouth 2 (two) times daily before a meal., Disp: 60 capsule, Rfl: 2   Probiotic Product (PROBIOTIC-10 PO), Take by mouth., Disp: , Rfl:   Observations/Objective: Patient is well-developed, well-nourished in no acute distress.  Resting comfortably at home.  Head is normocephalic, atraumatic.  No labored breathing.  Speech is clear and coherent with logical content.  Patient is alert and oriented at baseline.    Assessment and Plan: 1. BV (bacterial vaginosis) - metroNIDAZOLE (FLAGYL) 500 MG tablet; Take 1 tablet (500 mg total) by mouth 2 (two) times daily for 7 days.  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 The patient was provided an opportunity to ask questions  and all were answered. The patient agreed with the plan and demonstrated an understanding of the instructions.  A copy of instructions were sent to the patient via MyChart unless otherwise noted below.    The patient was advised to call back or seek an in-person evaluation if the symptoms worsen or if the condition fails to improve as anticipated.  Time:  I spent 8 minutes with the patient via telehealth technology discussing the above problems/concerns.    Margaretann Loveless, PA-C

## 2022-05-26 NOTE — Patient Instructions (Signed)
Tina Simon, thank you for joining Margaretann Loveless, PA-C for today's virtual visit.  While this provider is not your primary care provider (PCP), if your PCP is located in our provider database this encounter information will be shared with them immediately following your visit.  Consent: (Patient) Tina Simon provided verbal consent for this virtual visit at the beginning of the encounter.  Current Medications:  Current Outpatient Medications:    metroNIDAZOLE (FLAGYL) 500 MG tablet, Take 1 tablet (500 mg total) by mouth 2 (two) times daily for 7 days., Disp: 14 tablet, Rfl: 0   cetirizine (ZYRTEC) 10 MG tablet, Take 10 mg by mouth daily., Disp: , Rfl:    Cyanocobalamin 1000 MCG LOZG, Place under the tongue., Disp: , Rfl:    doxycycline (VIBRA-TABS) 100 MG tablet, Take 1 tablet (100 mg total) by mouth 2 (two) times daily., Disp: 14 tablet, Rfl: 0   fluticasone (FLONASE) 50 MCG/ACT nasal spray, SPRAY 2 SPRAYS INTO EACH NOSTRIL EVERY DAY, Disp: , Rfl:    gentamicin cream (GARAMYCIN) 0.1 %, Apply 1 application topically 3 (three) times daily., Disp: 15 g, Rfl: 0   ipratropium (ATROVENT) 0.06 % nasal spray, Place 2 sprays into both nostrils 4 (four) times daily., Disp: 15 mL, Rfl: 12   metFORMIN (GLUCOPHAGE-XR) 500 MG 24 hr tablet, Take by mouth., Disp: , Rfl:    Multiple Vitamins-Minerals (MULTIVITAMIN ADULT EXTRA C PO), Take by mouth., Disp: , Rfl:    omeprazole (PRILOSEC) 20 MG capsule, TAKE 1 CAPSULE (20 MG TOTAL) BY MOUTH 2 (TWO) TIMES DAILY BEFORE A MEAL., Disp: 60 capsule, Rfl: 2   omeprazole (PRILOSEC) 20 MG capsule, Take 1 capsule (20 mg total) by mouth 2 (two) times daily before a meal., Disp: 60 capsule, Rfl: 2   Probiotic Product (PROBIOTIC-10 PO), Take by mouth., Disp: , Rfl:    Medications ordered in this encounter:  Meds ordered this encounter  Medications   metroNIDAZOLE (FLAGYL) 500 MG tablet    Sig: Take 1 tablet (500 mg total) by mouth 2 (two) times daily for 7  days.    Dispense:  14 tablet    Refill:  0    Order Specific Question:   Supervising Provider    Answer:   Hyacinth Meeker, BRIAN [[id number]]     *If you need refills on other medications prior to your next appointment, please contact your pharmacy*  Follow-Up: Call back or seek an in-person evaluation if the symptoms worsen or if the condition fails to improve as anticipated.  Other Instructions Bacterial Vaginosis  Bacterial vaginosis is an infection that occurs when the normal balance of bacteria in the vagina changes. This change is caused by an overgrowth of certain bacteria in the vagina. Bacterial vaginosis is the most common vaginal infection among females aged 70 to 53 years. This condition increases the risk of sexually transmitted infections (STIs). Treatment can help reduce this risk. Treatment is very important for pregnant women because this condition can cause babies to be born early (prematurely) or at a low birth weight. What are the causes? This condition is caused by an increase in harmful bacteria that are normally present in small amounts in the vagina. However, the exact reason this condition develops is not known. You cannot get bacterial vaginosis from toilet seats, bedding, swimming pools, or contact with objects around you. What increases the risk? The following factors may make you more likely to develop this condition: Having a new sexual partner or multiple sexual partners,  or having unprotected sex. Douching. Having an intrauterine device (IUD). Smoking. Abusing drugs and alcohol. This may lead to riskier sexual behavior. Taking certain antibiotic medicines. Being pregnant. What are the signs or symptoms? Some women with this condition have no symptoms. Symptoms may include: Wallace Cullens or white vaginal discharge. The discharge can be watery or foamy. A fish-like odor with discharge, especially after sex or during menstruation. Itching in and around the vagina. Burning or  pain with urination. How is this diagnosed? This condition is diagnosed based on: Your medical history. A physical exam of the vagina. Checking a sample of vaginal fluid for harmful bacteria or abnormal cells. How is this treated? This condition is treated with antibiotic medicines. These may be given as a pill, a vaginal cream, or a medicine that is put into the vagina (suppository). If the condition comes back after treatment, a second round of antibiotics may be needed. Follow these instructions at home: Medicines Take or apply over-the-counter and prescription medicines only as told by your health care provider. Take or apply your antibiotic medicine as told by your health care provider. Do not stop using the antibiotic even if you start to feel better. General instructions If you have a female sexual partner, tell her that you have a vaginal infection. She should follow up with her health care provider. If you have a female sexual partner, he does not need treatment. Avoid sexual activity until you finish treatment. Drink enough fluid to keep your urine pale yellow. Keep the area around your vagina and rectum clean. Wash the area daily with warm water. Wipe yourself from front to back after using the toilet. If you are breastfeeding, talk to your health care provider about continuing breastfeeding during treatment. Keep all follow-up visits. This is important. How is this prevented? Self-care Do not douche. Wash the outside of your vagina with warm water only. Wear cotton or cotton-lined underwear. Avoid wearing tight pants and pantyhose, especially during the summer. Safe sex Use protection when having sex. This includes: Using condoms. Using dental dams. This is a thin layer of a material made of latex or polyurethane that protects the mouth during oral sex. Limit the number of sexual partners. To help prevent bacterial vaginosis, it is best to have sex with just one partner  (monogamous relationship). Make sure you and your sexual partner are tested for STIs. Drugs and alcohol Do not use any products that contain nicotine or tobacco. These products include cigarettes, chewing tobacco, and vaping devices, such as e-cigarettes. If you need help quitting, ask your health care provider. Do not use drugs. Do not drink alcohol if: Your health care provider tells you not to do this. You are pregnant, may be pregnant, or are planning to become pregnant. If you drink alcohol: Limit how much you have to 0-1 drink a day. Be aware of how much alcohol is in your drink. In the [place] one drink equals one 12 oz bottle of beer (355 mL), one 5 oz glass of wine (148 mL), or one 1 oz glass of hard liquor (44 mL). Where to find more information Centers for Disease Control and Prevention: FootballExhibition.com.br American Sexual Health Association (ASHA): www.ashastd.org U.S. Department of Health and Health and safety inspector, Office on Women's Health: http://hoffman.com/ Contact a health care provider if: Your symptoms do not improve, even after treatment. You have more discharge or pain when urinating. You have a fever or chills. You have pain in your abdomen or pelvis. You have pain during  sex. You have vaginal bleeding between menstrual periods. Summary Bacterial vaginosis is a vaginal infection that occurs when the normal balance of bacteria in the vagina changes. It results from an overgrowth of certain bacteria. This condition increases the risk of sexually transmitted infections (STIs). Getting treated can help reduce this risk. Treatment is very important for pregnant women because this condition can cause babies to be born early (prematurely) or at low birth weight. This condition is treated with antibiotic medicines. These may be given as a pill, a vaginal cream, or a medicine that is put into the vagina (suppository). This information is not intended to replace advice given to you by your  health care provider. Make sure you discuss any questions you have with your health care provider. Document Revised: 04/17/2020 Document Reviewed: 04/17/2020 Elsevier Patient Education  2023 Elsevier Inc.    If you have been instructed to have an in-person evaluation today at a local Urgent Care facility, please use the link below. It will take you to a list of all of our available [facility], including address, phone number and hours of operation. Please do not delay care.  [facility]  If you or a family member do not have a primary care provider, use the link below to schedule a visit and establish care. When you choose a [facility] primary care physician or advanced practice provider, you gain a long-term partner in health. Find a Primary Care Provider  Learn more about [facility]'s in-office and virtual care options: [facility] - Get Care Now

## 2022-05-29 ENCOUNTER — Encounter: Payer: Self-pay | Admitting: Physician Assistant

## 2022-06-01 ENCOUNTER — Ambulatory Visit
Admission: EM | Admit: 2022-06-01 | Discharge: 2022-06-01 | Disposition: A | Discharge status: Home / Self Care | Payer: Managed Care, Other (non HMO) | Attending: Family Medicine | Admitting: Family Medicine

## 2022-06-01 ENCOUNTER — Encounter: Payer: Self-pay | Admitting: Emergency Medicine

## 2022-06-01 DIAGNOSIS — J00 Acute nasopharyngitis [common cold]: Secondary | ICD-10-CM | POA: Diagnosis not present

## 2022-06-01 NOTE — ED Triage Notes (Addendum)
Pt c/o scratchy throat, sinus pain, congestion, fever, chills, and loss of appetite x 1 week. At home covid test was negative.

## 2022-06-01 NOTE — Discharge Instructions (Addendum)
Follow up with your primary care doctor in 2-3 days if not improving or fever returns.  Use Affrin as needed for nasal congestion.

## 2022-06-01 NOTE — ED Provider Notes (Signed)
[facility]    CSN: [id number] Arrival date & time: 06/01/22  2585      History   Chief Complaint Chief Complaint  Patient presents with   Fever   Chills   Cough   Generalized Body Aches    HPI Tina Simon is a 36 y.o. female.    Patient complains of sore throat with associated cough and nasal congestion, body aches, chills and fever.  Symptoms again about a week ago.  Patient notes that she had a fever of 101 F on Saturday.  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  She has tried TheraFlu, Sudafed sinus and ibuprofen with mild relief.  She last took Sudafed for sinus pressure this morning at 6 AM and ibuprofen at 1 PM yesterday.  She felt like her phlegm was stuck but this has improved.  The thing that bothers her the most is her nasal congestion.  She is not sleeping well due to this.  She is also not eating well.  She is staying hydrated.  Her home COVID test on Saturday was negative.  She has missed a couple days of work.   Past Medical History:  Diagnosis Date   Anemia    Diabetes mellitus without complication (HCC)    pre diabetes   Environmental allergies    GERD (gastroesophageal reflux disease)    HPV in female    IDA (iron deficiency anemia) 09/22/2020    Patient Active Problem List   Diagnosis Date Noted   Menorrhagia with regular cycle 07/16/2021   Dysphagia    IDA (iron deficiency anemia) 09/22/2020   Abnormal EKG 09/18/2020   Bartholin cyst 09/18/2020   Elevated blood pressure reading without diagnosis of hypertension 09/18/2020   Family history of heart murmur 09/18/2020   History of abnormal cervical Pap smear 09/29/2017   Heart murmur 04/14/2012    Past Surgical History:  Procedure Laterality Date   BARTHOLIN CYST MARSUPIALIZATION     ESOPHAGOGASTRODUODENOSCOPY (EGD) WITH PROPOFOL N/A 02/05/2021   Procedure: ESOPHAGOGASTRODUODENOSCOPY (EGD) WITH PROPOFOL;  Surgeon: Toney Reil,  MD;  Location: [facility] ENDOSCOPY;  Service: Gastroenterology;  Laterality: N/A;    OB History     Gravida  0   Para  0   Term  0   Preterm  0   AB  0   Living  0      SAB  0   IAB  0   Ectopic  0   Multiple  0   Live Births  0            Home Medications    Prior to Admission medications   Medication Sig Start Date End Date Taking? Authorizing Provider  Cyanocobalamin 1000 MCG LOZG Place under the tongue.   Yes [provider]  ipratropium (ATROVENT) 0.06 % nasal spray Place 2 sprays into both nostrils 4 (four) times daily. 08/03/21  Yes Becky Augusta, NP  Multiple Vitamins-Minerals (MULTIVITAMIN ADULT EXTRA C PO) Take by mouth.   Yes [provider]  Probiotic Product (PROBIOTIC-10 PO) Take by mouth.   Yes [provider]  cetirizine (ZYRTEC) 10 MG tablet Take 10 mg by mouth daily. 08/13/20   [provider]  doxycycline (VIBRA-TABS) 100 MG tablet Take 1 tablet (100 mg total) by mouth 2 (two) times daily. 08/28/21   Margaretann Loveless, PA-C  fluticasone (FLONASE) 50 MCG/ACT nasal spray SPRAY 2 SPRAYS INTO EACH NOSTRIL  EVERY DAY 12/25/20   [provider]  gentamicin cream (GARAMYCIN) 0.1 % Apply 1 application topically 3 (three) times daily. 08/25/21   Edrick Kins, DPM  metFORMIN (GLUCOPHAGE-XR) 500 MG 24 hr tablet Take by mouth. 06/11/20 06/11/21  [provider]  metroNIDAZOLE (FLAGYL) 500 MG tablet Take 1 tablet (500 mg total) by mouth 2 (two) times daily for 7 days. 05/26/22 06/02/22  Mar Daring, PA-C  omeprazole (PRILOSEC) 20 MG capsule TAKE 1 CAPSULE (20 MG TOTAL) BY MOUTH 2 (TWO) TIMES DAILY BEFORE A MEAL. 10/27/21 11/26/21  Lin Landsman, MD  omeprazole (PRILOSEC) 20 MG capsule Take 1 capsule (20 mg total) by mouth 2 (two) times daily before a meal. 10/27/21 11/26/21  Vanga, Tally Due, MD  ferrous sulfate 325 (65 FE) MG tablet Take 325 mg by mouth daily. 08/18/20 12/18/20  [provider]  triamcinolone (NASACORT) 55 MCG/ACT AERO nasal inhaler Place 2 sprays into the nose daily.  12/18/20  [provider]    Family History Family History  Problem Relation Age of Onset   Diabetes Mother    Hypertension Mother    Hypertension Father    Diabetes Father    Breast cancer Neg Hx    Ovarian cancer Neg Hx    Colon cancer Neg Hx     Social History Social History   Tobacco Use   Smoking status: Never   Smokeless tobacco: Never  Vaping Use   Vaping Use: Never used  Substance Use Topics   Alcohol use: No   Drug use: No     Allergies   Patient has no known allergies.   Review of Systems Review of Systems: See HPI    Physical Exam Triage Vital Signs ED Triage Vitals  Enc Vitals Group     BP      Pulse      Resp      Temp      Temp src      SpO2      Weight      Height      Head Circumference      Peak Flow      Pain Score      Pain Loc      Pain Edu?      Excl. in [place]?    No data found.  Updated Vital Signs BP (!) 138/94 (BP Location: Left Arm)   Pulse 100   Temp 98.9 F (37.2 C) (Oral)   Resp 18   LMP 05/19/2022   SpO2 100%   Visual Acuity Right Eye Distance:   Left Eye Distance:   Bilateral Distance:    Right Eye Near:   Left Eye Near:    Bilateral Near:     Physical Exam Vitals and nursing note reviewed.  Constitutional:      Appearance: Normal appearance.  HENT:     Head: Normocephalic and atraumatic.     Right Ear: Tympanic membrane normal.     Left Ear: Tympanic membrane normal.     Nose: Congestion present.     Mouth/Throat:     Mouth: Mucous membranes are moist.     Pharynx: Oropharynx is clear. No oropharyngeal exudate.  Eyes:     General:        Right eye: No discharge.        Left eye: No discharge.     Conjunctiva/sclera: Conjunctivae normal.     Pupils: Pupils are equal, round, and reactive to  light.  Cardiovascular:     Rate and Rhythm: Normal rate and regular rhythm.     Heart sounds: Normal  heart sounds.  Pulmonary:     Effort: Pulmonary effort is normal.     Breath sounds: Normal breath sounds.  Musculoskeletal:     Cervical back: Normal range of motion. No rigidity.  Lymphadenopathy:     Cervical: No cervical adenopathy.  Skin:    General: Skin is warm and dry.  Neurological:     Mental Status: She is alert.       UC Treatments / Results  Labs (all labs ordered are listed, but only abnormal results are displayed) Labs Reviewed - No data to display  EKG   Radiology No results found.  Procedures Procedures (including critical care time)  Medications Ordered in UC Medications - No data to display  Initial Impression / Assessment and Plan / UC Course  I have reviewed the triage vital signs and the nursing notes.  Pertinent labs & imaging results that were available during my care of the patient were reviewed by me and considered in my medical decision making (see chart for details).  History consistent with viral illness. Overall pt is well appearing, well hydrated, without respiratory distress. Discussed symptomatic treatment.  Use Afrin short-term for nasal congestion.  COVID test at home was reported negative.  Work note provided.  Return and ED precautions given and patient voiced understanding.  Patient to follow-up with PCP if not improving.        Final Clinical Impressions(s) / UC Diagnoses   Final diagnoses:  Acute nasopharyngitis     Discharge Instructions      Follow up with your primary care doctor in 2-3 days if not improving or fever returns.  Use Affrin as needed for nasal congestion.      ED Prescriptions   None    PDMP not reviewed this encounter.   Katha Cabal, DO 06/01/22 1030

## 2022-08-01 ENCOUNTER — Other Ambulatory Visit: Payer: Self-pay | Admitting: Gastroenterology

## 2022-08-10 ENCOUNTER — Other Ambulatory Visit: Payer: Self-pay | Admitting: Gastroenterology

## 2023-05-02 ENCOUNTER — Telehealth: Payer: Managed Care, Other (non HMO) | Admitting: Physician Assistant

## 2023-05-02 DIAGNOSIS — L731 Pseudofolliculitis barbae: Secondary | ICD-10-CM | POA: Diagnosis not present

## 2023-05-02 MED ORDER — CEPHALEXIN 500 MG PO CAPS: 500.0000 mg | ORAL_CAPSULE | Freq: Four times a day (QID) | ORAL | 0 refills | Status: DC

## 2023-05-02 NOTE — Progress Notes (Signed)
Virtual Visit Consent   Tina Simon, you are scheduled for a virtual visit with a [facility] provider today. Just as with appointments in the office, your consent must be obtained to participate. Your consent will be active for this visit and any virtual visit you may have with one of our providers in the next 365 days. If you have a MyChart account, a copy of this consent can be sent to you electronically.  As this is a virtual visit, video technology does not allow for your provider to perform a traditional examination. This may limit your provider's ability to fully assess your condition. If your provider identifies any concerns that need to be evaluated in person or the need to arrange testing (such as labs, EKG, etc.), we will make arrangements to do so. Although advances in technology are sophisticated, we cannot ensure that it will always work on either your end or our end. If the connection with a video visit is poor, the visit may have to be switched to a telephone visit. With either a video or telephone visit, we are not always able to ensure that we have a secure connection.  By engaging in this virtual visit, you consent to the provision of healthcare and authorize for your insurance to be billed (if applicable) for the services provided during this visit. Depending on your insurance coverage, you may receive a charge related to this service.  I need to obtain your verbal consent now. Are you willing to proceed with your visit today? Tina Simon has provided verbal consent on 05/02/2023 for a virtual visit (video or telephone). Margaretann Loveless, PA-C  Date: 05/02/2023 9:01 AM  Virtual Visit via Video Note   I, Margaretann Loveless, connected with  Tina Simon  ([id number], 1985-12-02) on 05/02/23 at  8:45 AM EDT by a video-enabled telemedicine application and verified that I am speaking with the correct person using two identifiers.  Location: Patient: Virtual Visit Location  Patient: Home Provider: Virtual Visit Location Provider: Home Office   I discussed the limitations of evaluation and management by telemedicine and the availability of in person appointments. The patient expressed understanding and agreed to proceed.    History of Present Illness: Tina Simon is a 37 y.o. who identifies as a female who was assigned female at birth, and is being seen today for ingrown hair. Had shaved and feels the hardness under the skin. Feels like a knot. It is not warm or red. Does have some irritation when clothes rub over top. Denies any pustule or purulent drainage. Denies fevers, chills, nausea/vomiting.    Problems:  Patient Active Problem List   Diagnosis Date Noted   Menorrhagia with regular cycle 07/16/2021   Dysphagia    IDA (iron deficiency anemia) 09/22/2020   Abnormal EKG 09/18/2020   Bartholin cyst 09/18/2020   Elevated blood pressure reading without diagnosis of hypertension 09/18/2020   Family history of heart murmur 09/18/2020   History of abnormal cervical Pap smear 09/29/2017   Heart murmur 04/14/2012    Allergies: No Known Allergies Medications:  Current Outpatient Medications:    cephALEXin (KEFLEX) 500 MG capsule, Take 1 capsule (500 mg total) by mouth 4 (four) times daily., Disp: 20 capsule, Rfl: 0   cetirizine (ZYRTEC) 10 MG tablet, Take 10 mg by mouth daily., Disp: , Rfl:    Cyanocobalamin 1000 MCG LOZG, Place under the tongue., Disp: , Rfl:    doxycycline (VIBRA-TABS) 100 MG tablet, Take 1  tablet (100 mg total) by mouth 2 (two) times daily., Disp: 14 tablet, Rfl: 0   fluticasone (FLONASE) 50 MCG/ACT nasal spray, SPRAY 2 SPRAYS INTO EACH NOSTRIL EVERY DAY, Disp: , Rfl:    gentamicin cream (GARAMYCIN) 0.1 %, Apply 1 application topically 3 (three) times daily., Disp: 15 g, Rfl: 0   ipratropium (ATROVENT) 0.06 % nasal spray, Place 2 sprays into both nostrils 4 (four) times daily., Disp: 15 mL, Rfl: 12   metFORMIN (GLUCOPHAGE-XR) 500 MG 24  hr tablet, Take by mouth., Disp: , Rfl:    Multiple Vitamins-Minerals (MULTIVITAMIN ADULT EXTRA C PO), Take by mouth., Disp: , Rfl:    omeprazole (PRILOSEC) 20 MG capsule, TAKE 1 CAPSULE (20 MG TOTAL) BY MOUTH 2 (TWO) TIMES DAILY BEFORE A MEAL., Disp: 60 capsule, Rfl: 2   omeprazole (PRILOSEC) 20 MG capsule, Take 1 capsule (20 mg total) by mouth 2 (two) times daily before a meal., Disp: 60 capsule, Rfl: 2   Probiotic Product (PROBIOTIC-10 PO), Take by mouth., Disp: , Rfl:   Observations/Objective: Patient is well-developed, well-nourished in no acute distress.  Resting comfortably at home.  Head is normocephalic, atraumatic.  No labored breathing.  Speech is clear and coherent with logical content.  Patient is alert and oriented at baseline.    Assessment and Plan: 1. Ingrown hair - cephALEXin (KEFLEX) 500 MG capsule; Take 1 capsule (500 mg total) by mouth 4 (four) times daily.  Dispense: 20 capsule; Refill: 0  - Keflex for infected ingrown hair - Warm compresses - Epsom salt soaks - Avoid constrictive clothing - Seek in person evaluation if worsening  Follow Up Instructions: I discussed the assessment and treatment plan with the patient. The patient was provided an opportunity to ask questions and all were answered. The patient agreed with the plan and demonstrated an understanding of the instructions.  A copy of instructions were sent to the patient via MyChart unless otherwise noted below.    The patient was advised to call back or seek an in-person evaluation if the symptoms worsen or if the condition fails to improve as anticipated.  Time:  I spent 8 minutes with the patient via telehealth technology discussing the above problems/concerns.    Margaretann Loveless, PA-C

## 2023-05-02 NOTE — Patient Instructions (Signed)
Tina Simon, thank you for joining Margaretann Loveless, PA-C for today's virtual visit.  While this provider is not your primary care provider (PCP), if your PCP is located in our provider database this encounter information will be shared with them immediately following your visit.   A [facility] MyChart account gives you access to today's visit and all your visits, tests, and labs performed at [facility] " click here if you don't have a [facility] MyChart account or go to [email or web address]  Consent: (Patient) Tina Simon provided verbal consent for this virtual visit at the beginning of the encounter.  Current Medications:  Current Outpatient Medications:    cephALEXin (KEFLEX) 500 MG capsule, Take 1 capsule (500 mg total) by mouth 4 (four) times daily., Disp: 20 capsule, Rfl: 0   cetirizine (ZYRTEC) 10 MG tablet, Take 10 mg by mouth daily., Disp: , Rfl:    Cyanocobalamin 1000 MCG LOZG, Place under the tongue., Disp: , Rfl:    doxycycline (VIBRA-TABS) 100 MG tablet, Take 1 tablet (100 mg total) by mouth 2 (two) times daily., Disp: 14 tablet, Rfl: 0   fluticasone (FLONASE) 50 MCG/ACT nasal spray, SPRAY 2 SPRAYS INTO EACH NOSTRIL EVERY DAY, Disp: , Rfl:    gentamicin cream (GARAMYCIN) 0.1 %, Apply 1 application topically 3 (three) times daily., Disp: 15 g, Rfl: 0   ipratropium (ATROVENT) 0.06 % nasal spray, Place 2 sprays into both nostrils 4 (four) times daily., Disp: 15 mL, Rfl: 12   metFORMIN (GLUCOPHAGE-XR) 500 MG 24 hr tablet, Take by mouth., Disp: , Rfl:    Multiple Vitamins-Minerals (MULTIVITAMIN ADULT EXTRA C PO), Take by mouth., Disp: , Rfl:    omeprazole (PRILOSEC) 20 MG capsule, TAKE 1 CAPSULE (20 MG TOTAL) BY MOUTH 2 (TWO) TIMES DAILY BEFORE A MEAL., Disp: 60 capsule, Rfl: 2   omeprazole (PRILOSEC) 20 MG capsule, Take 1 capsule (20 mg total) by mouth 2 (two) times daily before a meal., Disp: 60 capsule, Rfl: 2   Probiotic Product (PROBIOTIC-10 PO), Take  by mouth., Disp: , Rfl:    Medications ordered in this encounter:  Meds ordered this encounter  Medications   cephALEXin (KEFLEX) 500 MG capsule    Sig: Take 1 capsule (500 mg total) by mouth 4 (four) times daily.    Dispense:  20 capsule    Refill:  0    Order Specific Question:   Supervising Provider    Answer:   Merrilee Jansky [id number]     *If you need refills on other medications prior to your next appointment, please contact your pharmacy*  Follow-Up: Call back or seek an in-person evaluation if the symptoms worsen or if the condition fails to improve as anticipated.  [facility] [phone]  Other Instructions  Ingrown Hair  An ingrown hair is a hair that curls and re-enters the skin instead of growing straight out of the skin. An ingrown hair can develop in any part of the skin that hair is removed from. An ingrown hair may cause small pockets of infection. What are the causes? An ingrown hair may be caused by: Shaving. Tweezing. Waxing. Using a hair removal cream. What increases the risk? You are more likely to develop this condition if you have curly hair. What are the signs or symptoms? Symptoms of an ingrown hair may include: Small bumps on the skin. The bumps may be filled with pus. Pain. Itching. How is this diagnosed? An ingrown hair is diagnosed by  a skin exam. How is this treated? Treatment is often not needed unless the ingrown hair has caused an infection. If needed, treatment may include: Applying prescription creams to the skin. This can help with inflammation. Applying warm compresses to the skin. This can help soften the skin. Taking antibiotic medicine. An antibiotic may be prescribed if the infection is severe. Retracting and releasing the ingrown hair tips. Hair removal by electrolysis or laser. Follow these instructions at home: Medicines Take, apply, or use over-the-counter and prescription medicines only as told by your  health care provider. This includes any prescription creams. If you were prescribed an antibiotic medicine, take it as told by your health care provider. Do not stop using the antibiotic even if you start to feel better. General instructions Do not shave irritated areas of skin. You may start shaving these areas again once the irritation has gone away. To help remove ingrown hairs on your face, you may use a facial sponge in a gentle circular motion. Do not pick or squeeze the irritated area of skin as this may cause infection and scarring. Use a hair removal cream as told by your health care provider. Managing pain and swelling  If directed, apply heat to the affected area as often as told by your health care provider. Use the heat source that your health care provider recommends, such as a moist heat pack or a heating pad. Place a towel between your skin and the heat source. Leave the heat on for 20-30 minutes. If your skin turns bright red, remove the heat right away to prevent burns. The risk of burns is higher if you cannot feel pain, heat, or cold. How is this prevented? Shower before shaving. Wrap areas that you are going to shave in warm, moist wraps for several minutes before shaving. The warmth and moisture help to soften the hairs and makes ingrown hairs less likely. Use thick shaving gels. Use a razor that cuts hair slightly above your skin, or use an electric shaver with a long shave setting. Shave in the direction of hair growth. Avoid making multiple razor strokes. Apply a moisturizing lotion after shaving. Summary An ingrown hair is a hair that curls and re-enters the skin instead of growing straight out of the skin. Treatment is often not needed unless the ingrown hair has caused an infection. Take, apply, or use over-the-counter and prescription medicines only as told by your health care provider. This includes any prescription creams. Do not shave irritated areas of skin.  You may start shaving these areas again once the irritation has gone away. If directed, apply heat to the affected area. Use the heat source that your health care provider recommends, such as a moist heat pack or a heating pad. This information is not intended to replace advice given to you by your health care provider. Make sure you discuss any questions you have with your health care provider. Document Revised: 12/09/2021 Document Reviewed: 12/09/2021 Elsevier Patient Education  2024 Elsevier Inc.    If you have been instructed to have an in-person evaluation today at a local Urgent Care facility, please use the link below. It will take you to a list of all of our available [facility], including address, phone number and hours of operation. Please do not delay care.  [facility]  If you or a family member do not have a primary care provider, use the link below to schedule a visit and establish care. When  you choose a [facility] primary care physician or advanced practice provider, you gain a long-term partner in health. Find a Primary Care Provider  Learn more about [facility]'s in-office and virtual care options: [facility]

## 2023-08-04 ENCOUNTER — Telehealth: Payer: Self-pay | Admitting: Oncology

## 2023-08-04 ENCOUNTER — Other Ambulatory Visit: Payer: Self-pay | Admitting: Oncology

## 2023-08-04 NOTE — Telephone Encounter (Signed)
[facility] called and stated the pts hemoglobin was low and recommended her be seen by Dr Cathie Hoops for infusions. Please advise on scheduling for pt

## 2023-08-05 ENCOUNTER — Other Ambulatory Visit: Payer: Self-pay

## 2023-08-05 DIAGNOSIS — D509 Iron deficiency anemia, unspecified: Secondary | ICD-10-CM

## 2023-11-04 ENCOUNTER — Encounter: Payer: Self-pay | Admitting: Oncology

## 2023-12-13 ENCOUNTER — Telehealth: Payer: Managed Care, Other (non HMO) | Admitting: Family Medicine

## 2023-12-13 DIAGNOSIS — R112 Nausea with vomiting, unspecified: Secondary | ICD-10-CM | POA: Diagnosis not present

## 2023-12-13 MED ORDER — ONDANSETRON 4 MG PO TBDP
4.0000 mg | ORAL_TABLET | Freq: Three times a day (TID) | ORAL | 0 refills | Status: DC | PRN
Start: 2023-12-13 — End: 2024-08-24

## 2023-12-13 NOTE — Addendum Note (Signed)
Addended by: Waldon Merl on: 12/13/2023 02:57 PM   Modules accepted: Orders

## 2023-12-13 NOTE — Progress Notes (Signed)
E-Visit for Nausea and Vomiting   We are sorry that you are not feeling well. Here is how we plan to help!  Based on what you have shared with me it looks like you have a Virus that is irritating your GI tract.  Vomiting is the forceful emptying of a portion of the stomach's content through the mouth.  Although nausea and vomiting can make you feel miserable, it's important to remember that these are not diseases, but rather symptoms of an underlying illness.  When we treat short term symptoms, we always caution that any symptoms that persist should be fully evaluated in a medical office.  I have prescribed a medication that will help alleviate your symptoms and allow you to stay hydrated:  Zofran 4 mg 1 tablet every 8 hours as needed for nausea and vomiting  HOME CARE: Drink clear liquids.  This is very important! Dehydration (the lack of fluid) can lead to a serious complication.  Start off with 1 tablespoon every 5 minutes for 8 hours. You may begin eating bland foods after 8 hours without vomiting.  Start with saltine crackers, white bread, rice, mashed potatoes, applesauce. After 48 hours on a bland diet, you may resume a normal diet. Try to go to sleep.  Sleep often empties the stomach and relieves the need to vomit.  GET HELP RIGHT AWAY IF:  Your symptoms do not improve or worsen within 2 days after treatment. You have a fever for over 3 days. You cannot keep down fluids after trying the medication.  MAKE SURE YOU:  Understand these instructions. Will watch your condition. Will get help right away if you are not doing well or get worse.    Thank you for choosing an e-visit.  Your e-visit answers were reviewed by a board certified advanced clinical practitioner to complete your personal care plan. Depending upon the condition, your plan could have included both over the counter or prescription medications.  Please review your pharmacy choice. Make sure the pharmacy is open so  you can pick up prescription now. If there is a problem, you may contact your provider through [employer] and have the prescription routed to another pharmacy.  Your safety is important to [place]. If you have drug allergies check your prescription carefully.   For the next 24 hours you can use MyChart to ask questions about today's visit, request a non-urgent call back, or ask for a work or school excuse. You will get an email in the next two days asking about your experience. I hope that your e-visit has been valuable and will speed your recovery.  I provided 5 minutes of non face-to-face time during this encounter for chart review, medication and order placement, as well as and documentation.

## 2024-01-06 ENCOUNTER — Telehealth: Admitting: Family Medicine

## 2024-01-06 DIAGNOSIS — L739 Follicular disorder, unspecified: Secondary | ICD-10-CM | POA: Diagnosis not present

## 2024-01-06 DIAGNOSIS — L731 Pseudofolliculitis barbae: Secondary | ICD-10-CM | POA: Diagnosis not present

## 2024-01-06 MED ORDER — CEPHALEXIN 500 MG PO CAPS: 500.0000 mg | ORAL_CAPSULE | Freq: Three times a day (TID) | ORAL | 0 refills | Status: AC

## 2024-01-06 NOTE — Patient Instructions (Signed)
Folliculitis  Folliculitis occurs when hair follicles become inflamed. A hair follicle is a tiny opening in your skin where your hair grows from. This condition often occurs on the scalp, thighs, legs, back, and buttocks but can happen anywhere on the body. What are the causes? A common cause of this condition is an infection from bacteria. The type of folliculitis caused by bacteria can last a long time or go away and come back. The bacteria can live anywhere on your skin. They are often found in the nostrils. Other causes may include: An infection from a fungus. An infection from a virus. Your skin touching some chemicals, such as oils and tars. Shaving or waxing. Greasy ointments or creams put on the skin. What increases the risk? You are more likely to develop this condition if: Your body has a weak disease-fighting system (immune system). You have diabetes. You are obese. What are the signs or symptoms? Symptoms of this condition include: Redness. Soreness. Swelling. Itching. Small white or yellow, itchy spots filled with pus (pustules) that appear over a red area. If the infection goes deep into the follicle, these may turn into a boil (furuncle). A group of boils (carbuncle). These tend to form in hairy, sweaty areas of the body. How is this diagnosed? This condition is diagnosed with a skin exam. Your health care provider may take a sample of one of the pustules or boils to test in a lab. How is this treated? This condition may be treated by: Putting a warm, wet cloth (warm compress) on the affected areas. Taking antibiotics or applying them to the skin. Applying or bathing with a solution that kills germs (antiseptic). Taking an over-the-counter medicine. This can help with itching. Having a procedure to drain pustules or boils. This may be done if a pustule or boil contains a lot of pus or fluid. Having laser hair removal. This may be done when the condition lasts for a  long time. Follow these instructions at home: Managing pain and swelling  If directed, apply heat to the affected area as often as told by your health care provider. Use the heat source that your health care provider recommends, such as a moist heat pack or a heating pad. Place a towel between your skin and the heat source. Leave the heat on for 20-30 minutes. If your skin turns bright red, remove the heat right away to prevent burns. The risk of burns is higher if you cannot feel pain, heat, or cold. General instructions Take over-the-counter and prescription medicines only as told by your health care provider. If you were prescribed antibiotics, take or apply them as told by your health care provider. Do not stop using the antibiotic even if you start to feel better. Check your irritated area every day for signs of infection. Check for: More redness, swelling, or pain. Fluid or blood. Warmth. Pus or a bad smell. Do not shave irritated skin. Keep all follow-up visits. Your health care provider will check if the treatments are helping. Contact a health care provider if: You have a fever. You have any signs of infection. Red streaks are spreading from the affected area. This information is not intended to replace advice given to you by your health care provider. Make sure you discuss any questions you have with your health care provider. Document Revised: 03/23/2022 Document Reviewed: 03/23/2022 Elsevier Patient Education  2024 [employer].

## 2024-01-06 NOTE — Progress Notes (Signed)
 Virtual Visit Consent   Tina Simon, you are scheduled for a virtual visit with a [facility] provider today. Just as with appointments in the office, your consent must be obtained to participate. Your consent will be active for this visit and any virtual visit you may have with one of our providers in the next 365 days. If you have a MyChart account, a copy of this consent can be sent to you electronically.  As this is a virtual visit, video technology does not allow for your provider to perform a traditional examination. This may limit your provider's ability to fully assess your condition. If your provider identifies any concerns that need to be evaluated in person or the need to arrange testing (such as labs, EKG, etc.), we will make arrangements to do so. Although advances in technology are sophisticated, we cannot ensure that it will always work on either your end or our end. If the connection with a video visit is poor, the visit may have to be switched to a telephone visit. With either a video or telephone visit, we are not always able to ensure that we have a secure connection.  By engaging in this virtual visit, you consent to the provision of healthcare and authorize for your insurance to be billed (if applicable) for the services provided during this visit. Depending on your insurance coverage, you may receive a charge related to this service.  I need to obtain your verbal consent now. Are you willing to proceed with your visit today? NATHA GUIN has provided verbal consent on 01/06/2024 for a virtual visit (video or telephone). Georgana Curio, FNP  Date: 01/06/2024 9:08 AM   Virtual Visit via Video Note   I, Georgana Curio, connected with  Tina Simon  ([id number], 01/19/1986) on 01/06/24 at  9:15 AM EST by a video-enabled telemedicine application and verified that I am speaking with the correct person using two identifiers.  Location: Patient: Virtual Visit Location Patient:  Home Provider: Virtual Visit Location Provider: Home Office   I discussed the limitations of evaluation and management by telemedicine and the availability of in person appointments. The patient expressed understanding and agreed to proceed.    History of Present Illness: Tina Simon is a 38 y.o. who identifies as a female who was assigned female at birth, and is being seen today for ingrown infected hair follicle in bikini area red and inflamed. Marland Kitchen  HPI: HPI  Problems:  Patient Active Problem List   Diagnosis Date Noted   Menorrhagia with regular cycle 07/16/2021   Dysphagia    IDA (iron deficiency anemia) 09/22/2020   Abnormal EKG 09/18/2020   Bartholin cyst 09/18/2020   Elevated blood pressure reading without diagnosis of hypertension 09/18/2020   Family history of heart murmur 09/18/2020   History of abnormal cervical Pap smear 09/29/2017   Heart murmur 04/14/2012    Allergies: No Known Allergies Medications:  Current Outpatient Medications:    cephALEXin (KEFLEX) 500 MG capsule, Take 1 capsule (500 mg total) by mouth 3 (three) times daily for 7 days., Disp: 21 capsule, Rfl: 0   cephALEXin (KEFLEX) 500 MG capsule, Take 1 capsule (500 mg total) by mouth 4 (four) times daily., Disp: 20 capsule, Rfl: 0   cetirizine (ZYRTEC) 10 MG tablet, Take 10 mg by mouth daily., Disp: , Rfl:    Cyanocobalamin 1000 MCG LOZG, Place under the tongue., Disp: , Rfl:    doxycycline (VIBRA-TABS) 100 MG tablet, Take 1 tablet (100  mg total) by mouth 2 (two) times daily., Disp: 14 tablet, Rfl: 0   fluticasone (FLONASE) 50 MCG/ACT nasal spray, SPRAY 2 SPRAYS INTO EACH NOSTRIL EVERY DAY, Disp: , Rfl:    gentamicin cream (GARAMYCIN) 0.1 %, Apply 1 application topically 3 (three) times daily., Disp: 15 g, Rfl: 0   ipratropium (ATROVENT) 0.06 % nasal spray, Place 2 sprays into both nostrils 4 (four) times daily., Disp: 15 mL, Rfl: 12   metFORMIN (GLUCOPHAGE-XR) 500 MG 24 hr tablet, Take by mouth., Disp: ,  Rfl:    Multiple Vitamins-Minerals (MULTIVITAMIN ADULT EXTRA C PO), Take by mouth., Disp: , Rfl:    omeprazole (PRILOSEC) 20 MG capsule, TAKE 1 CAPSULE (20 MG TOTAL) BY MOUTH 2 (TWO) TIMES DAILY BEFORE A MEAL., Disp: 60 capsule, Rfl: 2   omeprazole (PRILOSEC) 20 MG capsule, Take 1 capsule (20 mg total) by mouth 2 (two) times daily before a meal., Disp: 60 capsule, Rfl: 2   ondansetron (ZOFRAN-ODT) 4 MG disintegrating tablet, Take 1 tablet (4 mg total) by mouth every 8 (eight) hours as needed for nausea or vomiting., Disp: 20 tablet, Rfl: 0   Probiotic Product (PROBIOTIC-10 PO), Take by mouth., Disp: , Rfl:   Observations/Objective: Patient is well-developed, well-nourished in no acute distress.  Resting comfortably  at home.  Head is normocephalic, atraumatic.  No labored breathing.  Speech is clear and coherent with logical content.  Patient is alert and oriented at baseline.    Assessment and Plan: 1. Folliculitis (Primary)  2. Ingrown hair  Warm compresses, f/u at urgent care if sx persist or worsen.   Follow Up Instructions: I discussed the assessment and treatment plan with the patient. The patient was provided an opportunity to ask questions and all were answered. The patient agreed with the plan and demonstrated an understanding of the instructions.  A copy of instructions were sent to the patient via MyChart unless otherwise noted below.     The patient was advised to call back or seek an in-person evaluation if the symptoms worsen or if the condition fails to improve as anticipated.    Georgana Curio, FNP

## 2024-08-24 ENCOUNTER — Telehealth: Admitting: Emergency Medicine

## 2024-08-24 DIAGNOSIS — M549 Dorsalgia, unspecified: Secondary | ICD-10-CM

## 2024-08-24 MED ORDER — NAPROXEN 375 MG PO TABS: 375.0000 mg | ORAL_TABLET | Freq: Two times a day (BID) | ORAL | 0 refills | Status: AC

## 2024-08-24 MED ORDER — CYCLOBENZAPRINE HCL 5 MG PO TABS: 5.0000 mg | ORAL_TABLET | Freq: Three times a day (TID) | ORAL | 0 refills | Status: AC | PRN

## 2024-08-24 NOTE — Progress Notes (Signed)
 We are sorry that you are not feeling well.  Here is how we plan to help!  Based on what you have shared with me it looks like you mostly have acute back pain.  Acute back pain is defined as musculoskeletal pain that can resolve in 1-3 weeks with conservative treatment.  I have prescribed Naprosyn 375 mg take one by mouth twice a day non-steroid anti-inflammatory (NSAID) as well as Flexeril 5 mg every eight hours as needed which is a muscle relaxer  Some patients experience stomach irritation or in increased heartburn with anti-inflammatory drugs.  Please keep in mind that muscle relaxer's can cause fatigue and should not be taken while at work or driving.  Back pain is very common.  The pain often gets better over time.  The cause of back pain is usually not dangerous.  Most people can learn to manage their back pain on their own.  Home Care Stay active.  Start with short walks on flat ground if you can.  Try to walk farther each day. Do not sit, drive or stand in one place for more than 30 minutes.  Do not stay in bed. Do not avoid exercise or work.  Activity can help your back heal faster. Be careful when you bend or lift an object.  Bend at your knees, keep the object close to you, and do not twist. Sleep on a firm mattress.  Lie on your side, and bend your knees.  If you lie on your back, put a pillow under your knees. Only take medicines as told by your doctor. Put ice on the injured area. Put ice in a plastic bag Place a towel between your skin and the bag Leave the ice on for 15-20 minutes, 3-4 times a day for the first 2-3 days. 210 After that, you can switch between ice and heat packs. Ask your doctor about back exercises or massage. Avoid feeling anxious or stressed.  Find good ways to deal with stress, such as exercise.  Get Help Right Way If: Your pain does not go away with rest or medicine. Your pain does not go away in 1 week. You have new problems. You do not feel well. The  pain spreads into your legs. You cannot control when you poop (bowel movement) or pee (urinate) You feel sick to your stomach (nauseous) or throw up (vomit) You have belly (abdominal) pain. You feel like you may pass out (faint). If you develop a fever.  Make Sure you: Understand these instructions. Will watch your condition Will get help right away if you are not doing well or get worse.  Your e-visit answers were reviewed by a board certified advanced clinical practitioner to complete your personal care plan.  Depending on the condition, your plan could have included both over the counter or prescription medications.  If there is a problem please reply  once you have received a response from your provider.  Your safety is important to us .  If you have drug allergies check your prescription carefully.    You can use MyChart to ask questions about today's visit, request a non-urgent call back, or ask for a work or school excuse for 24 hours related to this e-Visit. If it has been greater than 24 hours you will need to follow up with your provider, or enter a new e-Visit to address those concerns.  You will get an e-mail in the next two days asking about your experience.  I hope that  your e-visit has been valuable and will speed your recovery. Thank you for using e-visits.  I have spent 5 minutes in review of e-visit questionnaire, review and updating patient chart, medical decision making and response to patient.   Jon Belt, PhD, FNP-BC
